# Patient Record
Sex: Female | Born: 1970 | Race: White | Hispanic: No | Marital: Married | Smoking: Former smoker
Health system: Southern US, Community
[De-identification: ages and names within clinical notes are randomized; demographics above are authoritative.]

## PROBLEM LIST (undated history)

## (undated) DIAGNOSIS — B019 Varicella without complication: Secondary | ICD-10-CM

## (undated) DIAGNOSIS — T7840XA Allergy, unspecified, initial encounter: Secondary | ICD-10-CM

## (undated) DIAGNOSIS — F329 Major depressive disorder, single episode, unspecified: Secondary | ICD-10-CM

## (undated) DIAGNOSIS — F32A Depression, unspecified: Secondary | ICD-10-CM

## (undated) HISTORY — PX: ABDOMINOPLASTY: SUR9

## (undated) HISTORY — PX: AUGMENTATION MAMMAPLASTY: SUR837

## (undated) HISTORY — DX: Depression, unspecified: F32.A

## (undated) HISTORY — DX: Allergy, unspecified, initial encounter: T78.40XA

## (undated) HISTORY — DX: Varicella without complication: B01.9

---
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
## 2014-12-07 HISTORY — PX: BREAST ENHANCEMENT SURGERY: SHX7

## 2019-08-23 ENCOUNTER — Encounter: Payer: Self-pay | Admitting: Family Medicine

## 2019-08-23 ENCOUNTER — Ambulatory Visit (INDEPENDENT_AMBULATORY_CARE_PROVIDER_SITE_OTHER): Payer: PRIVATE HEALTH INSURANCE | Admitting: Family Medicine

## 2019-08-23 ENCOUNTER — Other Ambulatory Visit: Payer: Self-pay

## 2019-08-23 DIAGNOSIS — M25521 Pain in right elbow: Secondary | ICD-10-CM | POA: Diagnosis not present

## 2019-08-23 NOTE — Progress Notes (Signed)
   Office Visit Note   Patient: Allison Collins           Date of Birth: 20-Feb-1971           MRN: [id number] Visit Date: 08/23/2019 Requested by: No referring provider defined for this encounter. PCP: No primary care provider on file.  Subjective: Chief Complaint  Patient presents with  . Right Elbow - Pain    Pain lateral aspect, after hitting arm against something hard while packing to move approx 12 weeks ago. Numbness/tingling in the right hand/fingers.Right-hand dominant. Has had xray (neg) & cortisone injection (helped).    HPI: She is here with right elbow pain.  She is right-hand dominant.  About 3 or 4 months ago while preparing to move from [place] to [facility], she bumped her elbow against something hard.  She went to her orthopedist in [place] who took x-rays which were negative, then gave her a tennis elbow injection.  She was completely pain-free until the past couple weeks.  The pain is coming back again, similar to what it was before.  Denies any numbness or tingling in her hand.              ROS: Denies fevers or chills.  All other systems were reviewed and are negative.  Objective: Vital Signs: There were no vitals taken for this visit.  Physical Exam:  General:  Alert and oriented, in no acute distress. Pulm:  Breathing unlabored. Psy:  Normal mood, congruent affect. Skin: No atrophy or discoloration. Right elbow: Full flexion extension, full forearm pronation and supination.  She is point tender at the common extensor tendon at the lateral epicondyle.  No significant pain at the radial tunnel.  Moderate pain with wrist extension and third finger extension against resistance as well as forearm supination.  Imaging: Limited diagnostic ultrasound was performed but I did not bill for it.  She has tendinopathy changes at the common extensor tendon but no obvious tears.  Assessment & Plan: 1.  Left lateral epicondylitis -Discussed options with her and elected to  reinject today.  If symptoms return, then physical/hand therapy.     Procedures: Right elbow injection: After sterile prep with Betadine, injected 2 cc 1% lidocaine without epinephrine and 40 mg methylprednisolone into the area of maximum tenderness at the common extensor tendon.  She had excellent immediate relief.  She will be cautious with lifting for the next 10 to 14 days.   PMFS History: There are no active problems to display for this patient.  History reviewed. No pertinent past medical history.  History reviewed. No pertinent family history.  History reviewed. No pertinent surgical history. Social History   Occupational History  . Not on file  Tobacco Use  . Smoking status: Former Smoker    Types: Cigarettes    Quit date: 08/22/2005    Years since quitting: 14.0  . Smokeless tobacco: Never Used  Substance and Sexual Activity  . Alcohol use: Yes    Comment: rarely  . Drug use: Not on file  . Sexual activity: Not on file

## 2019-09-12 ENCOUNTER — Other Ambulatory Visit: Payer: Self-pay

## 2019-09-12 ENCOUNTER — Ambulatory Visit (INDEPENDENT_AMBULATORY_CARE_PROVIDER_SITE_OTHER): Payer: PRIVATE HEALTH INSURANCE | Admitting: Family Medicine

## 2019-09-12 ENCOUNTER — Encounter: Payer: Self-pay | Admitting: Family Medicine

## 2019-09-12 VITALS — BP 107/65 | HR 65 | Temp 98.0°F | Resp 18 | Ht 64.5 in | Wt 132.4 lb

## 2019-09-12 DIAGNOSIS — F320 Major depressive disorder, single episode, mild: Secondary | ICD-10-CM | POA: Diagnosis not present

## 2019-09-12 DIAGNOSIS — Z0000 Encounter for general adult medical examination without abnormal findings: Secondary | ICD-10-CM

## 2019-09-12 DIAGNOSIS — Z23 Encounter for immunization: Secondary | ICD-10-CM

## 2019-09-12 MED ORDER — FLUOXETINE HCL 20 MG PO CAPS: 20.0000 mg | ORAL_CAPSULE | Freq: Every day | ORAL | 1 refills | Status: DC

## 2019-09-12 NOTE — Progress Notes (Signed)
Patient ID: Allison Collins, female  DOB: 07-18-1971, 48 y.o.   MRN: [id number] Patient Care Team    Relationship Specialty Notifications Start End  Ma Hillock, DO PCP - General Family Medicine  09/12/19     Chief Complaint  Patient presents with  . [place] PCP Dr Sarajane Jews, [facility]. GYN-3-4 years, [place], [place].  Mammogram 2019. Needs refills and would like colonoscopy.     Subjective:  Allison Collins is a 48 y.o.  female present for new patient establishment. All past medical history, surgical history, allergies, family history, immunizations, medications and social history were updated in the electronic medical record today. All recent labs, ED visits and hospitalizations within the last year were reviewed. Pt recently moved from [place] with her family.   depression:  Pt reports she has been prescribed prozac since 2009 for depression. She reports current dose work well and she has no complaints. She would have moments of highs and lows when not on medication.    Depression screen PHQ 2/9 09/12/2019  Decreased Interest 0  Down, Depressed, Hopeless 0  PHQ - 2 Score 0  Altered sleeping 0  Tired, decreased energy 1  Change in appetite 0  Feeling bad or failure about yourself  2  Trouble concentrating 3  Moving slowly or fidgety/restless 0  Suicidal thoughts 0  PHQ-9 Score 6  Difficult doing work/chores Extremely dIfficult   GAD 7 : Generalized Anxiety Score 09/12/2019  Nervous, Anxious, on Edge 0  Control/stop worrying 0  Worry too much - different things 0  Trouble relaxing 3  Easily annoyed or irritable 3  Afraid - awful might happen 0  Anxiety Difficulty Extremely difficult       No flowsheet data found.  Immunization History  Administered Date(s) Administered  . Influenza,inj,Quad PF,6+ Mos 09/12/2019   No exam data present  Past Medical History:  Diagnosis Date  . Chicken pox   . Depression     No Known Allergies Past Surgical History:  Procedure Laterality Date  . ABDOMINOPLASTY    . BREAST ENHANCEMENT SURGERY    . CESAREAN SECTION  2014   Family History  Problem Relation Age of Onset  . Drug abuse Mother   . Early death Mother   . Stroke Mother   . Pulmonary fibrosis Mother        passed away from condition  . Hearing loss Father   . Alzheimer's disease Maternal Grandmother   . Colon cancer Maternal Grandfather    Social History   Social History Narrative   Marital status/children/pets: Married, 1 child   Education/employment: college, homemaker   Safety:      -smoke alarm in the home:Yes     - wears seatbelt: Yes     - Feels safe in their relationships: Yes    Allergies as of 09/12/2019   No Known Allergies     Medication List       Accurate as of September 12, 2019  1:28 PM. If you have any questions, ask your nurse or doctor.        BIOTIN PO Take by mouth daily.   FLUoxetine 20 MG capsule Commonly known as: PROZAC Take 20 mg by mouth daily.   multivitamin tablet Take 1 tablet by mouth daily.   nicotine polacrilex 4 MG gum Commonly known as: NICORETTE Take 4 mg by mouth as needed for smoking cessation.  All past medical history, surgical history, allergies, family history, immunizations andmedications were updated in the EMR today and reviewed under the history and medication portions of their EMR.    No results found for this or any previous visit (from the past 2160 hour(s)).  Patient was never admitted.   ROS: 14 pt review of systems performed and negative (unless mentioned in an HPI)  Objective: BP 107/65 (BP Location: Left Arm, Patient Position: Sitting, Cuff Size: Normal)   Pulse 65   Temp 98 F (36.7 C) (Temporal)   Resp 18   Ht 5' 4.5" (1.638 m)   Wt 132 lb 6 oz (60 kg)   LMP 08/26/2019 (Exact Date)   SpO2 98%   BMI 22.37 kg/m  Gen: Afebrile. No acute distress. Nontoxic in appearance, well-developed,  well-nourished,  Pleasant caucasian female.  HENT: AT. .  Eyes:Pupils Equal Round Reactive to light, Extraocular movements intact,  Conjunctiva without redness, discharge or icterus. CV: RRR Chest: CTAB, no wheeze, rhonchi or crackles.  Skin:Warm and well-perfused. Skin intact. Neuro/Msk: Normal gait. PERLA. EOMi. Alert. Oriented x3.   Psych: Normal affect, dress and demeanor. Normal speech. Normal thought content and judgment.   Assessment/plan: Allison Collins is a 48 y.o. female present for establish care Need for influenza vaccination - Flu Vaccine QUAD 36+ mos IM Depression, major, single episode, mild (HCC) Stable.  Refilled prozac 20 mg QD.  F/u q 6 mos    CPE in 3 mos.   Greater than 30 minutes spent with patient, >50% of time spent face to face.    Note is dictated utilizing voice recognition software. Although note has been proof read prior to signing, occasional typographical errors still can be missed. If any questions arise, please do not hesitate to call for verification.  Electronically signed by: Howard Pouch, DO [place]

## 2019-09-12 NOTE — Patient Instructions (Addendum)
Pleasure to meet you today.  I have refilled your medication. We will get back in 3 mos for physical and fasting labs.    Please help [place] help you:  We are honored you have chosen [facility] for your Primary Care home. Below you will find basic instructions that you may need to access in the future. Please help [place] help you by reading the instructions, which cover many of the frequent questions we experience.   Prescription refills and request:  -In order to allow more efficient response time, please call your pharmacy for all refills. They will forward the request electronically to [place]. This allows for the quickest possible response. Request left on a nurse line can take longer to refill, since these are checked as time allows between office patients and other phone calls.  - refill request can take up to 3-5 working days to complete.  - If request is sent electronically and request is appropiate, it is usually completed in 1-2 business days.  - all patients will need to be seen routinely for all chronic medical conditions requiring prescription medications (see follow-up below). If you are overdue for follow up on your condition, you will be asked to make an appointment and we will call in enough medication to cover you until your appointment (up to 30 days).  - all controlled substances will require a face to face visit to request/refill.  - if you desire your prescriptions to go through a new pharmacy, and have an active script at original pharmacy, you will need to call your pharmacy and have scripts transferred to new pharmacy. This is completed between the pharmacy locations and not by your provider.    Results: If any images or labs were ordered, it can take up to 1 week to get results depending on the test ordered and the lab/facility running and resulting the test. - Normal or stable results, which do not need further discussion, may be released to your mychart immediately with attached  note to you. A call may not be generated for normal results. Please make certain to sign up for mychart. If you have questions on how to activate your mychart you can call the front office.  - If your results need further discussion, our office will attempt to contact you via phone, and if unable to reach you after 2 attempts, we will release your abnormal result to your mychart with instructions.  - All results will be automatically released in mychart after 1 week.  - Your provider will provide you with explanation and instruction on all relevant material in your results. Please keep in mind, results and labs may appear confusing or abnormal to the untrained eye, but it does not mean they are actually abnormal for you personally. If you have any questions about your results that are not covered, or you desire more detailed explanation than what was provided, you should make an appointment with your provider to do so.   Our office handles many outgoing and incoming calls daily. If we have not contacted you within 1 week about your results, please check your mychart to see if there is a message first and if not, then contact our office.  In helping with this matter, you help decrease call volume, and therefore allow [place] to be able to respond to patients needs more efficiently.   Acute office visits (sick visit):  An acute visit is intended for a new problem and are scheduled in shorter time  slots to allow schedule openings for patients with new problems. This is the appropriate visit to discuss a new problem. Problems will not be addressed by phone call or Echart message. Appointment is needed if requesting treatment. In order to provide you with excellent quality medical care with proper time for you to explain your problem, have an exam and receive treatment with instructions, these appointments should be limited to one new problem per visit. If you experience a new problem, in which you desire to be  addressed, please make an acute office visit, we save openings on the schedule to accommodate you. Please do not save your new problem for any other type of visit, let us take care of it properly and quickly for you.   Follow up visits:  Depending on your condition(s) your provider will need to see you routinely in order to provide you with quality care and prescribe medication(s). Most chronic conditions (Example: hypertension, Diabetes, depression/anxiety... etc), require visits a couple times a year. Your provider will instruct you on proper follow up for your personal medical conditions and history. Please make certain to make follow up appointments for your condition as instructed. Failing to do so could result in lapse in your medication treatment/refills. If you request a refill, and are overdue to be seen on a condition, we will always provide you with a 30 day script (once) to allow you time to schedule.    Medicare wellness (well visit): - we have a wonderful Nurse Maudie Mercury), that will meet with you and provide you will yearly medicare wellness visits. These visits should occur yearly (can not be scheduled less than 1 calendar year apart) and cover preventive health, immunizations, advance directives and screenings you are entitled to yearly through your medicare benefits. Do not miss out on your entitled benefits, this is when medicare will pay for these benefits to be ordered for you.  These are strongly encouraged by your provider and is the appropriate type of visit to make certain you are up to date with all preventive health benefits. If you have not had your medicare wellness exam in the last 12 months, please make certain to schedule one by calling the office and schedule your medicare wellness with Maudie Mercury as soon as possible.   Yearly physical (well visit):  - Adults are recommended to be seen yearly for physicals. Check with your insurance and date of your last physical, most insurances  require one calendar year between physicals. Physicals include all preventive health topics, screenings, medical exam and labs that are appropriate for gender/age and history. You may have fasting labs needed at this visit. This is a well visit (not a sick visit), new problems should not be covered during this visit (see acute visit).  - Pediatric patients are seen more frequently when they are younger. Your provider will advise you on well child visit timing that is appropriate for your their age. - This is not a medicare wellness visit. Medicare wellness exams do not have an exam portion to the visit. Some medicare companies allow for a physical, some do not allow a yearly physical. If your medicare allows a yearly physical you can schedule the medicare wellness with our nurse Maudie Mercury and have your physical with your provider after, on the same day. Please check with insurance for your full benefits.   Late Policy/No Shows:  - all new patients should arrive 15-30 minutes earlier than appointment to allow [place] time  to  obtain all personal  demographics,  insurance information and for you to complete office paperwork. - All established patients should arrive 10-15 minutes earlier than appointment time to update all information and be checked in .  - In our best efforts to run on time, if you are late for your appointment you will be asked to either reschedule or if able, we will work you back into the schedule. There will be a wait time to work you back in the schedule,  depending on availability.  - If you are unable to make it to your appointment as scheduled, please call 24 hours ahead of time to allow [place] to fill the time slot with someone else who needs to be seen. If you do not cancel your appointment ahead of time, you may be charged a no show fee.

## 2019-10-30 ENCOUNTER — Telehealth: Payer: Self-pay | Admitting: Family Medicine

## 2019-10-30 NOTE — Telephone Encounter (Signed)
Pt was called and message was left letting patient know that she needed to schedule her CPE the first on the year to have all this ordered, Pt did not answer and VM was full. My chart message was sent.

## 2019-10-30 NOTE — Telephone Encounter (Signed)
Patient needs colonoscopy since she has turned 48. She has never had one.  Patient also is snoring so loud she is waking up her husband. Patient is requesting a referral for a sleep study.

## 2019-10-31 NOTE — Telephone Encounter (Signed)
MyChart message read. Pt scheduled appt for 12/1 for CPE

## 2019-11-07 ENCOUNTER — Other Ambulatory Visit: Payer: Self-pay

## 2019-11-08 ENCOUNTER — Encounter: Payer: Self-pay | Admitting: Family Medicine

## 2019-11-08 ENCOUNTER — Ambulatory Visit (INDEPENDENT_AMBULATORY_CARE_PROVIDER_SITE_OTHER): Payer: PRIVATE HEALTH INSURANCE | Admitting: Family Medicine

## 2019-11-08 ENCOUNTER — Encounter: Payer: Self-pay | Admitting: Gastroenterology

## 2019-11-08 VITALS — BP 104/67 | HR 63 | Temp 97.7°F | Resp 17 | Ht 64.85 in | Wt 139.5 lb

## 2019-11-08 DIAGNOSIS — R0683 Snoring: Secondary | ICD-10-CM | POA: Insufficient documentation

## 2019-11-08 DIAGNOSIS — Z23 Encounter for immunization: Secondary | ICD-10-CM | POA: Diagnosis not present

## 2019-11-08 DIAGNOSIS — Z130 Encounter for screening for diseases of the blood and blood-forming organs and certain disorders involving the immune mechanism: Secondary | ICD-10-CM

## 2019-11-08 DIAGNOSIS — Z1211 Encounter for screening for malignant neoplasm of colon: Secondary | ICD-10-CM

## 2019-11-08 DIAGNOSIS — Z1231 Encounter for screening mammogram for malignant neoplasm of breast: Secondary | ICD-10-CM

## 2019-11-08 DIAGNOSIS — Z0000 Encounter for general adult medical examination without abnormal findings: Secondary | ICD-10-CM | POA: Diagnosis not present

## 2019-11-08 DIAGNOSIS — Z13220 Encounter for screening for lipoid disorders: Secondary | ICD-10-CM

## 2019-11-08 DIAGNOSIS — F320 Major depressive disorder, single episode, mild: Secondary | ICD-10-CM | POA: Diagnosis not present

## 2019-11-08 DIAGNOSIS — Z131 Encounter for screening for diabetes mellitus: Secondary | ICD-10-CM

## 2019-11-08 DIAGNOSIS — Z01419 Encounter for gynecological examination (general) (routine) without abnormal findings: Secondary | ICD-10-CM

## 2019-11-08 NOTE — Patient Instructions (Signed)
Health Maintenance, Female Adopting a healthy lifestyle and getting preventive care are important in promoting health and wellness. Ask your health care provider about:  The right schedule for you to have regular tests and exams.  Things you can do on your own to prevent diseases and keep yourself healthy. What should I know about diet, weight, and exercise? Eat a healthy diet   Eat a diet that includes plenty of vegetables, fruits, low-fat dairy products, and lean protein.  Do not eat a lot of foods that are high in solid fats, added sugars, or sodium. Maintain a healthy weight Body mass index (BMI) is used to identify weight problems. It estimates body fat based on height and weight. Your health care provider can help determine your BMI and help you achieve or maintain a healthy weight. Get regular exercise Get regular exercise. This is one of the most important things you can do for your health. Most adults should:  Exercise for at least 150 minutes each week. The exercise should increase your heart rate and make you sweat (moderate-intensity exercise).  Do strengthening exercises at least twice a week. This is in addition to the moderate-intensity exercise.  Spend less time sitting. Even light physical activity can be beneficial. Watch cholesterol and blood lipids Have your blood tested for lipids and cholesterol at 50 years of age, then have this test every 5 years. Have your cholesterol levels checked more often if:  Your lipid or cholesterol levels are high.  You are older than 50 years of age.  You are at high risk for heart disease. What should I know about cancer screening? Depending on your health history and family history, you may need to have cancer screening at various ages. This may include screening for:  Breast cancer.  Cervical cancer.  Colorectal cancer.  Skin cancer.  Lung cancer. What should I know about heart disease, diabetes, and high blood  pressure? Blood pressure and heart disease  High blood pressure causes heart disease and increases the risk of stroke. This is more likely to develop in people who have high blood pressure readings, are of African descent, or are overweight.  Have your blood pressure checked: ? Every 3-5 years if you are 18-39 years of age. ? Every year if you are 40 years old or older. Diabetes Have regular diabetes screenings. This checks your fasting blood sugar level. Have the screening done:  Once every three years after age 40 if you are at a normal weight and have a low risk for diabetes.  More often and at a younger age if you are overweight or have a high risk for diabetes. What should I know about preventing infection? Hepatitis B If you have a higher risk for hepatitis B, you should be screened for this virus. Talk with your health care provider to find out if you are at risk for hepatitis B infection. Hepatitis C Testing is recommended for:  Everyone born from 1945 through 1965.  Anyone with known risk factors for hepatitis C. Sexually transmitted infections (STIs)  Get screened for STIs, including gonorrhea and chlamydia, if: ? You are sexually active and are younger than 50 years of age. ? You are older than 50 years of age and your health care provider tells you that you are at risk for this type of infection. ? Your sexual activity has changed since you were last screened, and you are at increased risk for chlamydia or gonorrhea. Ask your health care provider if   you are at risk.  Ask your health care provider about whether you are at high risk for HIV. Your health care provider may recommend a prescription medicine to help prevent HIV infection. If you choose to take medicine to prevent HIV, you should first get tested for HIV. You should then be tested every 3 months for as long as you are taking the medicine. Pregnancy  If you are about to stop having your period (premenopausal) and  you may become pregnant, seek counseling before you get pregnant.  Take 400 to 800 micrograms (mcg) of folic acid every day if you become pregnant.  Ask for birth control (contraception) if you want to prevent pregnancy. Osteoporosis and menopause Osteoporosis is a disease in which the bones lose minerals and strength with aging. This can result in bone fractures. If you are 65 years old or older, or if you are at risk for osteoporosis and fractures, ask your health care provider if you should:  Be screened for bone loss.  Take a calcium or vitamin D supplement to lower your risk of fractures.  Be given hormone replacement therapy (HRT) to treat symptoms of menopause. Follow these instructions at home: Lifestyle  Do not use any products that contain nicotine or tobacco, such as cigarettes, e-cigarettes, and chewing tobacco. If you need help quitting, ask your health care provider.  Do not use street drugs.  Do not share needles.  Ask your health care provider for help if you need support or information about quitting drugs. Alcohol use  Do not drink alcohol if: ? Your health care provider tells you not to drink. ? You are pregnant, may be pregnant, or are planning to become pregnant.  If you drink alcohol: ? Limit how much you use to 0-1 drink a day. ? Limit intake if you are breastfeeding.  Be aware of how much alcohol is in your drink. In the [place] one drink equals one 12 oz bottle of beer (355 mL), one 5 oz glass of wine (148 mL), or one 1 oz glass of hard liquor (44 mL). General instructions  Schedule regular health, dental, and eye exams.  Stay current with your vaccines.  Tell your health care provider if: ? You often feel depressed. ? You have ever been abused or do not feel safe at home. Summary  Adopting a healthy lifestyle and getting preventive care are important in promoting health and wellness.  Follow your health care provider's instructions about healthy  diet, exercising, and getting tested or screened for diseases.  Follow your health care provider's instructions on monitoring your cholesterol and blood pressure. This information is not intended to replace advice given to you by your health care provider. Make sure you discuss any questions you have with your health care provider. Document Released: 06/08/2011 Document Revised: 11/16/2018 Document Reviewed: 11/16/2018 [employer]vier Patient Education  2020 [employer]  

## 2019-11-08 NOTE — Progress Notes (Signed)
Patient ID: Allison Collins, female  DOB: 1971/03/17, 48 y.o.   MRN: [id number] Patient Care Team    Relationship Specialty Notifications Start End  Ma Hillock, DO PCP - General Family Medicine  09/12/19     Chief Complaint  Patient presents with  . Annual Exam    Not fasting. Pt had GYN and mammogram- 2019-Suffix [place] referral for GYN. Would like colonoscopy.     Subjective:  Allison Collins is a 48 y.o.  Female  present for CPE. All past medical history, surgical history, allergies, family history, immunizations, medications and social history were updated in the electronic medical record today. All recent labs, ED visits and hospitalizations within the last year were reviewed.  Pt complains today of snoring. She states her husband and her now have to sleep in separate rooms.  She has snored for years, but she has noticed an increase in the intensity of her snoring over the last year. She denies witness apneic spells or excessive daytime sleepiness. Body mass index is 23.32 kg/m.   Patient reports she snores a great deal.  Her husband and family has noted that her snoring has worsened over the last year.  They are now sleeping in separate bedrooms secondary to her snoring.  She states she often wakes her own self with her snoring.  She denies any known apneic spells.  She is fatigued but denies daytime somnolence.  She would like referral for evaluation for sleep study.  Health maintenance:  Colonoscopy: MGF had colon cancer in his 63. Referral placed today.  Mammogram: completed:2019, [place]>> ordered today. Pt has silicone implants.  Cervical cancer screening: last pap: she is uncertain her last.  Immunizations: tdap updated today, Influenza UTD 2020(encouraged yearly), shingrix #1 today.>> N0.2 in 3 mos by nurse visit.  Infectious disease screening: HIV declined DEXA: routine screen Assistive device: none Oxygen MOL:MBEM Patient has a  Dental home. Hospitalizations/ED visits: none   Depression screen [facility] 2/9 11/08/2019 09/12/2019  Decreased Interest 0 0  Down, Depressed, Hopeless 0 0  PHQ - 2 Score 0 0  Altered sleeping 0 0  Tired, decreased energy 0 1  Change in appetite 0 0  Feeling bad or failure about yourself  0 2  Trouble concentrating 0 3  Moving slowly or fidgety/restless 0 0  Suicidal thoughts 0 0  PHQ-9 Score 0 6  Difficult doing work/chores Not difficult at all Extremely dIfficult   GAD 7 : Generalized Anxiety Score 09/12/2019  Nervous, Anxious, on Edge 0  Control/stop worrying 0  Worry too much - different things 0  Trouble relaxing 3  Easily annoyed or irritable 3  Afraid - awful might happen 0  Anxiety Difficulty Extremely difficult     Immunization History  Administered Date(s) Administered  . Influenza,inj,Quad PF,6+ Mos 09/12/2019    Past Medical History:  Diagnosis Date  . Chicken pox   . Depression    No Known Allergies Past Surgical History:  Procedure Laterality Date  . ABDOMINOPLASTY    . BREAST ENHANCEMENT SURGERY  2016   saline > 10 yrs with revision 7544 to silicone.   Marland Kitchen CESAREAN SECTION  2014   Family History  Problem Relation Age of Onset  . Drug abuse Mother   . Early death Mother 45  . Stroke Mother   . Pulmonary fibrosis Mother        passed away from condition  . Hearing loss Father   . Alzheimer's disease  Maternal Grandmother   . Colon cancer Maternal Grandfather    Social History   Social History Narrative   Marital status/children/pets: Married, 1 child   Education/employment: college, homemaker   Safety:      -smoke alarm in the home:Yes     - wears seatbelt: Yes     - Feels safe in their relationships: Yes    Allergies as of 11/08/2019   No Known Allergies     Medication List       Accurate as of November 08, 2019  2:24 PM. If you have any questions, ask your nurse or doctor.        BIOTIN PO Take by mouth daily.   FLUoxetine 20 MG  capsule Commonly known as: PROZAC Take 1 capsule (20 mg total) by mouth daily.   multivitamin tablet Take 1 tablet by mouth daily.   nicotine polacrilex 4 MG gum Commonly known as: NICORETTE Take 4 mg by mouth as needed for smoking cessation.       All past medical history, surgical history, allergies, family history, immunizations andmedications were updated in the EMR today and reviewed under the history and medication portions of their EMR.     No results found for this or any previous visit (from the past 2160 hour(s)).  Patient was never admitted.   ROS: 14 pt review of systems performed and negative (unless mentioned in an HPI)  Objective: BP 104/67 (BP Location: Left Arm, Patient Position: Sitting, Cuff Size: Normal)   Pulse 63   Temp 97.7 F (36.5 C) (Temporal)   Resp 17   Ht 5' 4.85" (1.647 m)   Wt 139 lb 8 oz (63.3 kg)   LMP 10/25/2019 (Approximate)   SpO2 100%   BMI 23.32 kg/m  Gen: Afebrile. No acute distress. Nontoxic in appearance, well-developed, well-nourished,  Pleasant caucasian female.  HENT: AT. [place]. Bilateral TM visualized and normal in appearance, normal external auditory canal. MMM, no oral lesions, adequate dentition. Bilateral nares within normal limits.  Septum midline.  Throat without erythema, ulcerations or exudates. Tonsils normal.  no Cough on exam, no hoarseness on exam.  Eyes:Pupils Equal Round Reactive to light, Extraocular movements intact,  Conjunctiva without redness, discharge or icterus. Neck/lymp/endocrine: Supple,no lymphadenopathy, no thyromegaly CV: RRR no murmur, no edema, +2/4 P posterior tibialis pulse. No JVD. Chest: CTAB, no wheeze, rhonchi or crackles. normal Respiratory effort. good Air movement. Abd: Soft. flat. NTND. BS present. no Masses palpated. No hepatosplenomegaly. No rebound tenderness or guarding. Skin: no rashes, purpura or petechiae. Warm and well-perfused. Skin intact. Neuro/Msk:  Normal gait. PERLA. EOMi. Alert.  Oriented x3.  Cranial nerves II through XII intact. Muscle strength 5/5 upper/lower extremity. DTRs equal bilaterally. Psych: Normal affect, dress and demeanor. Normal speech. Normal thought content and judgment.   No exam data present  Assessment/plan: Allison Collins is a 48 y.o. female present for CPE Depression, major, single episode, mild (HCC) Stable.  Continue Prozac 20 mg daily.  Follow-up every 6 months. - TSH Diabetes mellitus screening - HgB A1c Screening cholesterol level - Lipid panel Screening for deficiency anemia - CBC - Comp Met (CMET) Needs to establish with GYN for routine Visit for pelvic exam - Ambulatory referral to Gynecology Encounter for screening mammogram for malignant neoplasm of breast - MM DIGITAL SCREENING W/ IMPLANTS BILATERAL; Future Snoring She would like evaluation for sleep study today.  Referral placed to pulmonology. - Ambulatory referral to Pulmonology Colon cancer screen: Referred to GI.   Encounter for  preventive Health exam:  Patient was encouraged to exercise greater than 150 minutes a week. Patient was encouraged to choose a diet filled with fresh fruits and vegetables, and lean meats. AVS provided to patient today for education/recommendation on gender specific health and safety maintenance. Colonoscopy: MGF had colon cancer in his 75. Referral placed today.  Mammogram: completed:[place], [place]>> ordered today. Pt has silicone implants.  Cervical cancer screening: last pap: she is uncertain her last.  Referral placed to gynecology today. Immunizations: tdap updated today, Influenza UTD 2020(encouraged yearly), shingrix #1 today.>> N0.2 in 3 mos by nurse visit.  Infectious disease screening: HIV declined DEXA: routine screen  This visit occurred during the SARS-CoV-2 public health emergency.  Safety protocols were in place, including screening questions prior to the visit, additional usage of staff PPE, and extensive cleaning of exam  room while observing appropriate contact time as indicated for disinfecting solutions.   Orders Placed This Encounter  Procedures  . MM DIGITAL SCREENING W/ IMPLANTS BILATERAL  . CBC  . Comp Met (CMET)  . HgB A1c  . Lipid panel  . TSH  . Ambulatory referral to Gynecology  . Ambulatory referral to Pulmonology  . Ambulatory referral to Gastroenterology    Return in about 1 year (around 11/07/2020) for CPE (30 min). and routinely per OV note for [facility].    Electronically signed by: Howard Pouch, DO [place]

## 2019-11-09 LAB — COMPREHENSIVE METABOLIC PANEL
AG Ratio: 1.7 (calc) (ref 1.0–2.5)
ALT: 25 U/L (ref 6–29)
AST: 29 U/L (ref 10–35)
Albumin: 4.6 g/dL (ref 3.6–5.1)
Alkaline phosphatase (APISO): 62 U/L (ref 37–153)
BUN: 16 mg/dL (ref 7–25)
CO2: 26 mmol/L (ref 20–32)
Calcium: 9.4 mg/dL (ref 8.6–10.4)
Chloride: 103 mmol/L (ref 98–110)
Creat: 0.93 mg/dL (ref 0.50–1.05)
Globulin: 2.7 g/dL (calc) (ref 1.9–3.7)
Glucose, Bld: 66 mg/dL (ref 65–99)
Potassium: 4.5 mmol/L (ref 3.5–5.3)
Sodium: 139 mmol/L (ref 135–146)
Total Bilirubin: 0.5 mg/dL (ref 0.2–1.2)
Total Protein: 7.3 g/dL (ref 6.1–8.1)

## 2019-11-09 LAB — TSH: TSH: 1.34 mIU/L

## 2019-11-09 LAB — CBC
HCT: 36.8 % (ref 35.0–45.0)
Hemoglobin: 12.1 g/dL (ref 11.7–15.5)
MCH: 28.8 pg (ref 27.0–33.0)
MCHC: 32.9 g/dL (ref 32.0–36.0)
MCV: 87.6 fL (ref 80.0–100.0)
MPV: 10.7 fL (ref 7.5–12.5)
Platelets: 315 10*3/uL (ref 140–400)
RBC: 4.2 10*6/uL (ref 3.80–5.10)
RDW: 12.9 % (ref 11.0–15.0)
WBC: 5.3 10*3/uL (ref 3.8–10.8)

## 2019-11-09 LAB — LIPID PANEL
Cholesterol: 238 mg/dL — ABNORMAL HIGH (ref <200)
HDL: 104 mg/dL (ref >=50)
LDL Cholesterol (Calc): 119 mg/dL (calc) — ABNORMAL HIGH
Non-HDL Cholesterol (Calc): 134 mg/dL (calc) — ABNORMAL HIGH (ref <130)
Total CHOL/HDL Ratio: 2.3 (calc) (ref <5.0)
Triglycerides: 56 mg/dL (ref <150)

## 2019-11-09 LAB — HEMOGLOBIN A1C
Hgb A1c MFr Bld: 5.4 % of total Hgb (ref <5.7)
Mean Plasma Glucose: 108 (calc)
eAG (mmol/L): 6 (calc)

## 2019-11-13 ENCOUNTER — Encounter: Payer: Self-pay | Admitting: Family Medicine

## 2019-11-29 ENCOUNTER — Ambulatory Visit (AMBULATORY_SURGERY_CENTER): Payer: PRIVATE HEALTH INSURANCE | Admitting: *Deleted

## 2019-11-29 ENCOUNTER — Other Ambulatory Visit: Payer: Self-pay

## 2019-11-29 VITALS — Temp 96.9°F | Ht 64.85 in | Wt 140.0 lb

## 2019-11-29 DIAGNOSIS — Z1211 Encounter for screening for malignant neoplasm of colon: Secondary | ICD-10-CM

## 2019-11-29 DIAGNOSIS — Z1159 Encounter for screening for other viral diseases: Secondary | ICD-10-CM

## 2019-11-29 MED ORDER — SUPREP BOWEL PREP KIT 17.5-3.13-1.6 GM/177ML PO SOLN: 1.0000 | Freq: Once | ORAL | 0 refills | Status: AC

## 2019-11-29 NOTE — Progress Notes (Signed)
No egg or soy allergy known to patient  No issues with past sedation with any surgeries  or procedures, no intubation problems  No diet pills per patient No home 02 use per patient  No blood thinners per patient  Pt denies issues with constipation  No A fib or A flutter  EMMI video sent to pt's e mail   Due to the COVID-19 pandemic we are asking patients to follow these guidelines. Please only bring one care partner. Please be aware that your care partner may wait in the car in the parking lot or if they feel like they will be too hot to wait in the car, they may wait in the lobby on the 4th floor. All care partners are required to wear a mask the entire time (we do not have any that we can provide them), they need to practice social distancing, and we will do a Covid check for all patient's and care partners when you arrive. Also we will check their temperature and your temperature. If the care partner waits in their car they need to stay in the parking lot the entire time and we will call them on their cell phone when the patient is ready for discharge so they can bring the car to the front of the building. Also all patient's will need to wear a mask into building. Suprep $15

## 2019-12-04 ENCOUNTER — Encounter: Payer: Self-pay | Admitting: Gastroenterology

## 2019-12-05 ENCOUNTER — Ambulatory Visit (INDEPENDENT_AMBULATORY_CARE_PROVIDER_SITE_OTHER): Payer: PRIVATE HEALTH INSURANCE | Admitting: Neurology

## 2019-12-05 ENCOUNTER — Other Ambulatory Visit: Payer: Self-pay

## 2019-12-05 ENCOUNTER — Encounter: Payer: Self-pay | Admitting: Neurology

## 2019-12-05 VITALS — BP 118/84 | HR 65 | Temp 97.6°F | Ht 63.0 in | Wt 148.0 lb

## 2019-12-05 DIAGNOSIS — R0683 Snoring: Secondary | ICD-10-CM | POA: Diagnosis not present

## 2019-12-05 DIAGNOSIS — G4752 REM sleep behavior disorder: Secondary | ICD-10-CM | POA: Diagnosis not present

## 2019-12-05 DIAGNOSIS — J3089 Other allergic rhinitis: Secondary | ICD-10-CM | POA: Diagnosis not present

## 2019-12-05 NOTE — Patient Instructions (Signed)

## 2019-12-05 NOTE — Progress Notes (Signed)
SLEEP MEDICINE CLINIC    Provider:  Larey Seat, MD  Primary Care Physician:  Ma Hillock, DO [address] [place] [address]     Referring Provider: Ma Hillock, Do [address],  [place] [address]          Chief Complaint according to patient   Patient presents with:    . New Patient (Initial Visit)           HISTORY OF PRESENT ILLNESS:  Allison Collins is a 48 y.o. year old Caucasian female patient seen here upon  referral on 12/05/2019 from Dr. Raoul Pitch for a report of loud snoring, excessive daytime sleepiness, sleep eating and sleep masturbation.      I have the pleasure of seeing Allison Collins today, a right -handed [place]  female with a possible sleep disorder.  She  has a past medical history of Allergy, Chicken pox, and Depression.    Sleep relevant medical history: loud snoring wakes her husband up, she has been eating at night,  At about 3 AM- has recollection of these frequent events. Has also masturbated in her sleep.    Social history:  Patient is working as Agricultural engineer and lives in a household with 3 persons. Family status is married with one son , 2 stepchildren. .  Pets are present, dogs. She has a horse.  Tobacco use: quit in pregnancy- 2006 ETOH use : socially,  Caffeine intake in form of Coffee( 3 cups) Soda( rare ) Tea ( rare) or energy drinks. Regular exercise in form of riding.   Hobbies : equestrian , Merchandiser, retail, motorcycling.    Sleep habits are as follows: The patient's dinner time is between 6 PM. She cooks dinner.  The patient goes to bed at 11 PM and continues to sleep for 2-3 hours, wakes for 1 bathroom break. She goes right back to sleep.  The preferred sleep position is left side , with the support of 3 pillows.  Dreams are reportedly frequent/vivid.   7 AM is the usual rise time. The patient wakes up spontaneously at about 6.30  She reports  feeling happy, refreshed - restored in AM,  without symptoms such as dry mouth , morning headaches or residual fatigue.  Naps are taken frequently,  Sometimes from 6-8 PM! - 2 hours of napping , but not affecting the night time sleep. Can be  refreshing than nocturnal sleep.     Her husband struggles with NASH, with obesity, asthma post -[id number] , he was a Higher education careers adviser in [place]. Has AAA. Abdominal hernias.    Review of Systems: Out of a complete 14 system review, the patient complains of only the following symptoms, and all other reviewed systems are negative.:  Fatigue, sleepiness , loud snoring, fragmented sleep, I   How likely are you to doze in the following situations: 0 = not likely, 1 = slight chance, 2 = moderate chance, 3 = high chance   Sitting and Reading? Watching Television? Sitting inactive in a public place (theater or meeting)? As a passenger in a car for an hour without a break? Lying down in the afternoon when circumstances permit? Sitting and talking to someone? Sitting quietly after lunch without alcohol? In a car, while stopped for a few minutes in traffic?   Total = 6/ 24 points   FSS endorsed at 21/ 63 points.   Social History   Socioeconomic History  . Marital status: Married  Spouse name: Not on file  . Number of children: Not on file  . Years of education: Not on file  . Highest education level: Not on file  Occupational History  . Not on file  Tobacco Use  . Smoking status: Former Smoker    Types: Cigarettes    Quit date: 08/22/2005    Years since quitting: 14.2  . Smokeless tobacco: Never Used  Substance and Sexual Activity  . Alcohol use: Yes    Comment: rarely  . Drug use: Yes    Types: Marijuana  . Sexual activity: Yes    Partners: Male  Other Topics Concern  . Not on file  Social History Narrative   Marital status/children/pets: Married, 1 child   Education/employment: college, homemaker   Safety:      -smoke alarm in the home:Yes     - wears seatbelt: Yes     - Feels safe in  their relationships: Yes   Social Determinants of Health   Financial Resource Strain:   . Difficulty of Paying Living Expenses: Not on file  Food Insecurity:   . Worried About Charity fundraiser in the Last Year: Not on file  . Ran Out of Food in the Last Year: Not on file  Transportation Needs:   . Lack of Transportation (Medical): Not on file  . Lack of Transportation (Non-Medical): Not on file  Physical Activity:   . Days of Exercise per Week: Not on file  . Minutes of Exercise per Session: Not on file  Stress:   . Feeling of Stress : Not on file  Social Connections:   . Frequency of Communication with Friends and Family: Not on file  . Frequency of Social Gatherings with Friends and Family: Not on file  . Attends Religious Services: Not on file  . Active Member of Clubs or Organizations: Not on file  . Attends Archivist Meetings: Not on file  . Marital Status: Not on file    Family History  Problem Relation Age of Onset  . Drug abuse Mother   . Early death Mother 77  . Stroke Mother   . Pulmonary fibrosis Mother        passed away from condition  . Hearing loss Father   . Alzheimer's disease Maternal Grandmother   . Colon cancer Maternal Grandfather   . Colon polyps Neg Hx   . Esophageal cancer Neg Hx   . Rectal cancer Neg Hx   . Stomach cancer Neg Hx     Past Medical History:  Diagnosis Date  . Allergy   . Chicken pox   . Depression     Past Surgical History:  Procedure Laterality Date  . ABDOMINOPLASTY    . BREAST ENHANCEMENT SURGERY  2016   saline > 10 yrs with revision [id number] to silicone.   Marland Kitchen CESAREAN SECTION  2014     Current Outpatient Medications on File Prior to Visit  Medication Sig Dispense Refill  . BIOTIN PO Take by mouth daily.    Marland Kitchen FLUoxetine (PROZAC) 20 MG capsule Take 1 capsule (20 mg total) by mouth daily. 90 capsule 1  . Multiple Vitamin (MULTIVITAMIN) tablet Take 1 tablet by mouth daily.    . nicotine polacrilex  (NICORETTE) 4 MG gum Take 4 mg by mouth as needed for smoking cessation.     No current facility-administered medications on file prior to visit.    No Known Allergies  Physical exam:  Today's Vitals  12/05/19 1254  BP: 118/84  Pulse: 65  Temp: 97.6 F (36.4 C)  Weight: 148 lb (67.1 kg)  Height: 5\' 3"  (1.6 m)   Body mass index is 26.22 kg/m.   Wt Readings from Last 3 Encounters:  12/05/19 148 lb (67.1 kg)  11/29/19 140 lb (63.5 kg)  11/08/19 139 lb 8 oz (63.3 kg)     Ht Readings from Last 3 Encounters:  12/05/19 5\' 3"  (1.6 m)  11/29/19 5' 4.85" (1.647 m)  11/08/19 5' 4.85" (1.647 m)      General: The patient is awake, alert and appears not in acute distress. The patient is well groomed. Head: Normocephalic, atraumatic. Neck is supple. Mallampati 3 with an elongated Uvula. ,  neck circumference:14 inches . Nasal airflow patent. Raspy voice.  Retrognathia is not seen.  Dental status:  Perfect.  Cardiovascular:  Regular rate and cardiac rhythm by pulse,  without distended neck veins. Respiratory: Lungs are clear to auscultation.  Skin:  Without evidence of ankle edema, or rash. Trunk: The patient's posture is erect.   Neurologic exam : The patient is awake and alert, oriented to place and time.   Memory subjective described as intact.  Attention span & concentration ability appears normal.  Speech is fluent,  without  dysarthria, dysphonia or aphasia.  Mood and affect are appropriate.   Cranial nerves: no loss of smell or taste reported  Pupils are equal and briskly reactive to light. Funduscopic exam deferred.   Extraocular movements in vertical and horizontal planes were intact and without nystagmus. No Diplopia. Visual fields by finger perimetry are intact. Hearing was intact to soft voice and finger rubbing.    Facial sensation intact to fine touch.  Facial motor strength is symmetric and tongue and uvula move midline.  Neck ROM : rotation, tilt and flexion  extension were normal for age and shoulder shrug was symmetrical.    Motor exam:  Symmetric bulk, tone and ROM.   Normal tone without cog- wheeling, symmetric grip strength .  Sensory:  Fine touch, pinprick and vibration were tested and normal.  Proprioception tested in the upper extremities was normal.   Coordination: Rapid alternating movements in the fingers/hands were of normal speed.  The Finger-to-nose maneuver was intact without evidence of ataxia, dysmetria or tremor.  Gait and station: Patient could rise unassisted from a seated position, walked without assistive device.  Stance is of normal width/ base and the patient turned with 3 steps ( RN observed) .  Toe and heel walk were deferred.  Deep tendon reflexes: in the  upper and lower extremities are symmetric and intact.  Babinski response was deferred.      After spending a total time of   minutes face to face and additional time for physical and neurologic examination, review of laboratory studies,  personal review of imaging studies, reports and results of other testing and review of referral information / records as far as provided in visit, I have established the following assessments:  1)  Loud snoring in a patient with seasonal rhinitis and loudest when in supine sleep is not a surprise.  Her airway has excessive soft tissue and she has become a mouth breather. I am less sure about apnea. It is definitely something to check out.   2)  Sleep eating may be related to late night snacks of simple carbs- replacing these with peanut butter or cheese may give a longer effect thus avoiding the nocturnal appetite.   3) recent  carb and sweet cravings are likely related to menopause/ peri menopause.   4) dream related sexual activity is not a sleep disorder, it is dream-enactment, non- threatening behavior. .     My Plan is to proceed with:  1) PSG with expanded EEG montage / a REM BD-  or HST to screen for apnea .    HST as the  patient wants to concentrate on apnea.   I would like to thank Dr Raoul Pitch for allowing me to meet with and to take care of this pleasant patient.   I plan to follow up either personally or through our NP within 2-3 month.   CC: I will share my notes with PCP.  Electronically signed by: Larey Seat, MD 12/05/2019 1:15 PM  [facility] Board certified by The [employer] of Sleep Medicine and Diplomate of the [employer] of Sleep Medicine. Board certified In Neurology through the [place], Fellow of the [employer] of Neurology. Medical Director of [employer].

## 2019-12-11 ENCOUNTER — Ambulatory Visit (INDEPENDENT_AMBULATORY_CARE_PROVIDER_SITE_OTHER): Payer: PRIVATE HEALTH INSURANCE

## 2019-12-11 ENCOUNTER — Other Ambulatory Visit: Payer: Self-pay | Admitting: Gastroenterology

## 2019-12-11 DIAGNOSIS — Z1159 Encounter for screening for other viral diseases: Secondary | ICD-10-CM

## 2019-12-12 LAB — SARS CORONAVIRUS 2 (TAT 6-24 HRS): SARS Coronavirus 2: NEGATIVE

## 2019-12-13 ENCOUNTER — Encounter: Payer: Self-pay | Admitting: Family Medicine

## 2019-12-13 ENCOUNTER — Ambulatory Visit (AMBULATORY_SURGERY_CENTER): Payer: PRIVATE HEALTH INSURANCE | Admitting: Gastroenterology

## 2019-12-13 ENCOUNTER — Other Ambulatory Visit: Payer: Self-pay

## 2019-12-13 ENCOUNTER — Ambulatory Visit (INDEPENDENT_AMBULATORY_CARE_PROVIDER_SITE_OTHER): Payer: PRIVATE HEALTH INSURANCE | Admitting: Family Medicine

## 2019-12-13 ENCOUNTER — Encounter: Payer: Self-pay | Admitting: Gastroenterology

## 2019-12-13 VITALS — BP 97/59 | HR 58 | Temp 97.4°F | Resp 13 | Ht 64.0 in | Wt 140.0 lb

## 2019-12-13 DIAGNOSIS — Z1211 Encounter for screening for malignant neoplasm of colon: Secondary | ICD-10-CM | POA: Diagnosis not present

## 2019-12-13 DIAGNOSIS — D120 Benign neoplasm of cecum: Secondary | ICD-10-CM | POA: Diagnosis not present

## 2019-12-13 DIAGNOSIS — M25521 Pain in right elbow: Secondary | ICD-10-CM | POA: Diagnosis not present

## 2019-12-13 MED ORDER — SODIUM CHLORIDE 0.9 % IV SOLN: 500.0000 mL | Freq: Once | INTRAVENOUS | Status: DC

## 2019-12-13 NOTE — Patient Instructions (Signed)
Handouts given:  Polyps Resume previous diet Continue current medications Await pathology results on 3 polyps    YOU HAD AN ENDOSCOPIC PROCEDURE TODAY AT [facility]:   Refer to the procedure report that was given to you for any specific questions about what was found during the examination.  If the procedure report does not answer your questions, please call your gastroenterologist to clarify.  If you requested that your care partner not be given the details of your procedure findings, then the procedure report has been included in a sealed envelope for you to review at your convenience later.  YOU SHOULD EXPECT: Some feelings of bloating in the abdomen. Passage of more gas than usual.  Walking can help get rid of the air that was put into your GI tract during the procedure and reduce the bloating. If you had a lower endoscopy (such as a colonoscopy or flexible sigmoidoscopy) you may notice spotting of blood in your stool or on the toilet paper. If you underwent a bowel prep for your procedure, you may not have a normal bowel movement for a few days.  Please Note:  You might notice some irritation and congestion in your nose or some drainage.  This is from the oxygen used during your procedure.  There is no need for concern and it should clear up in a day or so.  SYMPTOMS TO REPORT IMMEDIATELY:   Following lower endoscopy (colonoscopy or flexible sigmoidoscopy):  Excessive amounts of blood in the stool  Significant tenderness or worsening of abdominal pains  Swelling of the abdomen that is new, acute  Fever of 100F or higher    For urgent or emergent issues, a gastroenterologist can be reached at any hour by calling [phone].   DIET:  We do recommend a small meal at first, but then you may proceed to your regular diet.  Drink plenty of fluids but you should avoid alcoholic beverages for 24 hours.  ACTIVITY:  You should plan to take it easy for the rest of today  and you should NOT DRIVE or use heavy machinery until tomorrow (because of the sedation medicines used during the test).    FOLLOW UP: Our staff will call the number listed on your records 48-72 hours following your procedure to check on you and address any questions or concerns that you may have regarding the information given to you following your procedure. If we do not reach you, we will leave a message.  We will attempt to reach you two times.  During this call, we will ask if you have developed any symptoms of COVID 19. If you develop any symptoms (ie: fever, flu-like symptoms, shortness of breath, cough etc.) before then, please call [phone].  If you test positive for Covid 19 in the 2 weeks post procedure, please call and report this information to [place].    If any biopsies were taken you will be contacted by phone or by letter within the next 1-3 weeks.  Please call us at [phone] if you have not heard about the biopsies in 3 weeks.    SIGNATURES/CONFIDENTIALITY: You and/or your care partner have signed paperwork which will be entered into your electronic medical record.  These signatures attest to the fact that that the information above on your After Visit Summary has been reviewed and is understood.  Full responsibility of the confidentiality of this discharge information lies with you and/or your care-partner.

## 2019-12-13 NOTE — Progress Notes (Signed)
Temp JB  VS KA  Pt's states no medical or surgical changes since previsit or office visit.

## 2019-12-13 NOTE — Progress Notes (Signed)
Office Visit Note   Patient: Allison Collins           Date of Birth: 08/10/1971           MRN: [id number] Visit Date: 12/13/2019 Requested by: Ma Hillock, DO [address] [place],  [place] [address] PCP: Ma Hillock, DO  Subjective: Chief Complaint  Patient presents with  . Right Elbow - Pain    Elbow injection helped from 08/23/19. Pain returned 2 days ago. Cannot hold 5 lbs due to pain. Wakes her up.    HPI: She is here with recurrent right elbow pain.  Injection in September helped until 2 days ago.  This was her second injection in 1 year.  No injury, pain started in the same area as before with some radiation toward the wrist.  No numbness or tingling.              ROS:   All other systems were reviewed and are negative.  Objective: Vital Signs: There were no vitals taken for this visit.  Physical Exam:  General:  Alert and oriented, in no acute distress. Pulm:  Breathing unlabored. Psy:  Normal mood, congruent affect. Skin: No erythema or bruising. Right elbow: Full active range of motion.  Point tender at the common extensor tendon at the lateral epicondyle.  Also slightly tender at the radial tunnel.  No pain with wrist extension or third finger extension but she does have pain with forearm pronation and supination.  Imaging: None today  Assessment & Plan: 1.  Recurrent right elbow pain with probable lateral epicondylitis, cannot rule out radial tunnel syndrome. -We will reinject today per patient request.  Referral to physical therapy in [facility].  If symptoms persist, consider evaluation of the radial tunnel.     Procedures: Right elbow injection: After sterile prep with Betadine, injected 3 cc 1% lidocaine without epinephrine and 40 mg methylprednisolone into the area of maximum tenderness at the common extensor tendon.   PMFS History: Patient Active Problem List   Diagnosis Date Noted  . Snoring 11/08/2019  . Encounter for preventive health  examination 11/08/2019  . Depression, major, single episode, mild ([facility]) 09/12/2019   Past Medical History:  Diagnosis Date  . Allergy   . Chicken pox   . Depression     Family History  Problem Relation Age of Onset  . Drug abuse Mother   . Early death Mother 23  . Stroke Mother   . Pulmonary fibrosis Mother        passed away from condition  . Hearing loss Father   . Alzheimer's disease Maternal Grandmother   . Colon cancer Maternal Grandfather   . Colon polyps Neg Hx   . Esophageal cancer Neg Hx   . Rectal cancer Neg Hx   . Stomach cancer Neg Hx     Past Surgical History:  Procedure Laterality Date  . ABDOMINOPLASTY    . BREAST ENHANCEMENT SURGERY  2016   saline > 10 yrs with revision [id number] to silicone.   Marland Kitchen CESAREAN SECTION  2014   Social History   Occupational History  . Not on file  Tobacco Use  . Smoking status: Former Smoker    Types: Cigarettes    Quit date: 08/22/2005    Years since quitting: 14.3  . Smokeless tobacco: Never Used  Substance and Sexual Activity  . Alcohol use: Yes    Comment: rarely  . Drug use: Yes    Types: Marijuana  .  Sexual activity: Yes    Partners: Male

## 2019-12-13 NOTE — Op Note (Signed)
[facility] Patient Name: Allison Collins Procedure Date: 12/13/2019 7:54 AM MRN: [id number] Endoscopist: [place] Park MD, MD Age: 49 Referring MD:  Date of Birth: 22-Jun-1971 Gender: Female Account #: [id number] Procedure:                Colonoscopy Indications:              Screening for colorectal malignant neoplasm, This                            is the patient's first colonoscopy                           No first degree family members with colon cancer or                            polyps                           No second degree family members with colon or                            cancer before age 54 Medicines:                Monitored Anesthesia Care Procedure:                Pre-Anesthesia Assessment:                           - Prior to the procedure, a History and Physical                            was performed, and patient medications and                            allergies were reviewed. The patient's tolerance of                            previous anesthesia was also reviewed. The risks                            and benefits of the procedure and the sedation                            options and risks were discussed with the patient.                            All questions were answered, and informed consent                            was obtained. Prior Anticoagulants: The patient has                            taken no previous anticoagulant or antiplatelet  agents. ASA Grade Assessment: I - A normal, healthy                            patient. After reviewing the risks and benefits,                            the patient was deemed in satisfactory condition to                            undergo the procedure.                           After obtaining informed consent, the colonoscope                            was passed under direct vision. Throughout the                            procedure, the patient's blood  pressure, pulse, and                            oxygen saturations were monitored continuously. The                            Colonoscope was introduced through the anus and                            advanced to the the cecum, identified by                            appendiceal orifice and ileocecal valve. A second                            forward view of the right colon was performed. The                            colonoscopy was performed with difficulty due to                            restricted mobility of the colon, significant                            looping and a tortuous colon. The rectosigmoid                            junction is fixed. Successful completion of the                            procedure was aided by applying abdominal pressure.                            The patient tolerated the procedure well. The  quality of the bowel preparation was good. The                            ileocecal valve, appendiceal orifice, and rectum                            were photographed. Scope In: 8:06:02 AM Scope Out: 8:24:27 AM Scope Withdrawal Time: 0 hours 13 minutes 12 seconds  Total Procedure Duration: 0 hours 18 minutes 25 seconds  Findings:                 The perianal and digital rectal examinations were                            normal.                           Three sessile polyps were found in the cecum. The                            polyps were less than 1 mm in size. These polyps                            were removed with a cold snare in a piecemeal                            fashion. Resection and retrieval of at least 2                            polyps were complete. Estimated blood loss: none.                           The exam was otherwise without abnormality on                            direct and retroflexion views. Complications:            No immediate complications. Estimated blood loss:                             None. Estimated Blood Loss:     Estimated blood loss: none. Impression:               - Three less than 1 mm polyps in the cecum, removed                            with a cold snare. Resected and retrieved.                           - The examination was otherwise normal on direct                            and retroflexion views. Recommendation:           - Patient has a contact number available for  emergencies. The signs and symptoms of potential                            delayed complications were discussed with the                            patient. Return to normal activities tomorrow.                            Written discharge instructions were provided to the                            patient.                           - Resume previous diet.                           - Continue present medications.                           - Await pathology results.                           - Repeat colonoscopy in 3 years for surveillance if                            all 3 polyps are adenomas. Otherwise, will tailor                            recommendations based on the pathology results. [place] Park MD, MD 12/13/2019 8:31:05 AM This report has been signed electronically.

## 2019-12-13 NOTE — Progress Notes (Signed)
Called to room to assist during endoscopic procedure.  Patient ID and intended procedure confirmed with present staff. Received instructions for my participation in the procedure from the performing physician.  

## 2019-12-13 NOTE — Progress Notes (Signed)
Pt tolerated well. VSS. Awake and to recovery. 

## 2019-12-15 ENCOUNTER — Telehealth: Payer: Self-pay | Admitting: *Deleted

## 2019-12-15 ENCOUNTER — Other Ambulatory Visit: Payer: Self-pay

## 2019-12-15 ENCOUNTER — Telehealth: Payer: Self-pay

## 2019-12-15 NOTE — Telephone Encounter (Signed)
LVM

## 2019-12-15 NOTE — Telephone Encounter (Signed)
  Follow up Call-  Call back number 12/13/2019  Post procedure Call Back phone  # [phone]  Permission to leave phone message Yes     Patient questions:  Do you have a fever, pain , or abdominal swelling? No. Pain Score  0 *  Have you tolerated food without any problems? Yes.    Have you been able to return to your normal activities? Yes.    Do you have any questions about your discharge instructions: Diet   No. Medications  No. Follow up visit  No.  Do you have questions or concerns about your Care? No.  Actions: * If pain score is 4 or above: No action needed, pain <4.  1. Have you developed a fever since your procedure? no  2.   Have you had an respiratory symptoms (SOB or cough) since your procedure? no  3.   Have you tested positive for COVID 19 since your procedure no  4.   Have you had any family members/close contacts diagnosed with the COVID 19 since your procedure?  no   If yes to any of these questions please route to Joylene John, RN and Alphonsa Gin, Therapist, sports.

## 2019-12-18 ENCOUNTER — Ambulatory Visit: Payer: PRIVATE HEALTH INSURANCE | Admitting: Women's Health

## 2019-12-18 ENCOUNTER — Encounter: Payer: Self-pay | Admitting: Gastroenterology

## 2019-12-18 ENCOUNTER — Ambulatory Visit (INDEPENDENT_AMBULATORY_CARE_PROVIDER_SITE_OTHER): Payer: PRIVATE HEALTH INSURANCE | Admitting: Neurology

## 2019-12-18 ENCOUNTER — Other Ambulatory Visit: Payer: Self-pay

## 2019-12-18 ENCOUNTER — Encounter: Payer: Self-pay | Admitting: Women's Health

## 2019-12-18 VITALS — BP 110/78 | Ht 64.0 in | Wt 140.0 lb

## 2019-12-18 DIAGNOSIS — J3089 Other allergic rhinitis: Secondary | ICD-10-CM

## 2019-12-18 DIAGNOSIS — G4733 Obstructive sleep apnea (adult) (pediatric): Secondary | ICD-10-CM

## 2019-12-18 DIAGNOSIS — Z01419 Encounter for gynecological examination (general) (routine) without abnormal findings: Secondary | ICD-10-CM | POA: Diagnosis not present

## 2019-12-18 DIAGNOSIS — R0683 Snoring: Secondary | ICD-10-CM

## 2019-12-18 DIAGNOSIS — G4752 REM sleep behavior disorder: Secondary | ICD-10-CM

## 2019-12-18 NOTE — Progress Notes (Signed)
Allison Collins July 29, 1971 [id number]    History:  49 yo MWF G1P0 presents for annual exam with no complaints. Normal monthly cycle. Sexually active, no desire for contraception. Reports hot flashes. Denies vaginal dryness, urinary symptoms, discharge, abdominal pain. Normal pap and mammogram history. 12/20202 Colonoscopy with 3 polyps, follow up 3 years.   Past medical history, past surgical history, family history and social history were all reviewed and documented in the EPIC chart.  Homemaker.  Grandfather deceased from colon cancer. Healthy 51 yo son. 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  ROS:  A ROS was performed and pertinent positives and negatives are included.  Exam:  Vitals:   12/18/19 1132  BP: 110/78  Weight: 140 lb (63.5 kg)  Height: 5\' 4"  (1.626 m)   Body mass index is 24.03 kg/m.   General appearance:  Normal Thyroid:  Symmetrical, normal in size, without palpable masses or nodularity. Respiratory  Auscultation:  Clear without wheezing or rhonchi Cardiovascular  Auscultation:  Regular rate, without rubs, murmurs or gallops  Edema/varicosities:  Not grossly evident Abdominal  Soft,nontender, without masses, guarding or rebound. Abdominoplasty scar well-healed .   Liver/spleen:  No organomegaly noted  Hernia:  None appreciated  Skin  Inspection:  Grossly normal   Breasts: Examined lying and sitting. Bilateral implants    Right: Without masses, discharge or axillary adenopathy. Implant. Retraction lower areola since implant      surgery 49 years ago, no change.     Left: Without masses, retractions, discharge or axillary adenopathy. Implant.  Gentitourinary   Inguinal/mons:  Normal without inguinal adenopathy  External genitalia:  Normal  BUS/Urethra/Skene's glands:  Normal  Vagina:  Normal  Cervix:  Normal  Uterus:  normal in size, shape and contour.  Midline and mobile  Adnexa/parametria:      Rt: Without masses or tenderness.   Lt: Without masses or tenderness.  Anus and perineum: Normal    Assessment/Plan:  49 y.o. MWF G1P0  for annual exam with no complaints.   Monthly cycle/no contraception conception okay Anxiety/depression stable on Prozac per primary care Labs-primary care  Plan: Continue with scheduled mammogram, new guidelines reviewed,  Pap with HR HPV typing today.  Recommended vitamin D 2000 daily.  Continue healthy lifestyle of regular daily walking, biking and hiking.    [place], 1:07 PM 12/18/2019

## 2019-12-18 NOTE — Patient Instructions (Signed)
Good to meet you! Vit D 2000 iu daily Health Maintenance, Female Adopting a healthy lifestyle and getting preventive care are important in promoting health and wellness. Ask your health care provider about:  The right schedule for you to have regular tests and exams.  Things you can do on your own to prevent diseases and keep yourself healthy. What should I know about diet, weight, and exercise? Eat a healthy diet   Eat a diet that includes plenty of vegetables, fruits, low-fat dairy products, and lean protein.  Do not eat a lot of foods that are high in solid fats, added sugars, or sodium. Maintain a healthy weight Body mass index (BMI) is used to identify weight problems. It estimates body fat based on height and weight. Your health care provider can help determine your BMI and help you achieve or maintain a healthy weight. Get regular exercise Get regular exercise. This is one of the most important things you can do for your health. Most adults should:  Exercise for at least 150 minutes each week. The exercise should increase your heart rate and make you sweat (moderate-intensity exercise).  Do strengthening exercises at least twice a week. This is in addition to the moderate-intensity exercise.  Spend less time sitting. Even light physical activity can be beneficial. Watch cholesterol and blood lipids Have your blood tested for lipids and cholesterol at 51 years of age, then have this test every 5 years. Have your cholesterol levels checked more often if:  Your lipid or cholesterol levels are high.  You are older than 51 years of age.  You are at high risk for heart disease. What should I know about cancer screening? Depending on your health history and family history, you may need to have cancer screening at various ages. This may include screening for:  Breast cancer.  Cervical cancer.  Colorectal cancer.  Skin cancer.  Lung cancer. What should I know about heart  disease, diabetes, and high blood pressure? Blood pressure and heart disease  High blood pressure causes heart disease and increases the risk of stroke. This is more likely to develop in people who have high blood pressure readings, are of African descent, or are overweight.  Have your blood pressure checked: ? Every 3-5 years if you are 44-61 years of age. ? Every year if you are 81 years old or older. Diabetes Have regular diabetes screenings. This checks your fasting blood sugar level. Have the screening done:  Once every three years after age 3 if you are at a normal weight and have a low risk for diabetes.  More often and at a younger age if you are overweight or have a high risk for diabetes. What should I know about preventing infection? Hepatitis B If you have a higher risk for hepatitis B, you should be screened for this virus. Talk with your health care provider to find out if you are at risk for hepatitis B infection. Hepatitis C Testing is recommended for:  Everyone born from [birth year] through 1967.  Anyone with known risk factors for hepatitis C. Sexually transmitted infections (STIs)  Get screened for STIs, including gonorrhea and chlamydia, if: ? You are sexually active and are younger than 51 years of age. ? You are older than 51 years of age and your health care provider tells you that you are at risk for this type of infection. ? Your sexual activity has changed since you were last screened, and you are at increased risk for  chlamydia or gonorrhea. Ask your health care provider if you are at risk.  Ask your health care provider about whether you are at high risk for HIV. Your health care provider may recommend a prescription medicine to help prevent HIV infection. If you choose to take medicine to prevent HIV, you should first get tested for HIV. You should then be tested every 3 months for as long as you are taking the medicine. Pregnancy  If you are about to stop  having your period (premenopausal) and you may become pregnant, seek counseling before you get pregnant.  Take 400 to 800 micrograms (mcg) of folic acid every day if you become pregnant.  Ask for birth control (contraception) if you want to prevent pregnancy. Osteoporosis and menopause Osteoporosis is a disease in which the bones lose minerals and strength with aging. This can result in bone fractures. If you are 64 years old or older, or if you are at risk for osteoporosis and fractures, ask your health care provider if you should:  Be screened for bone loss.  Take a calcium or vitamin D supplement to lower your risk of fractures.  Be given hormone replacement therapy (HRT) to treat symptoms of menopause. Follow these instructions at home: Lifestyle  Do not use any products that contain nicotine or tobacco, such as cigarettes, e-cigarettes, and chewing tobacco. If you need help quitting, ask your health care provider.  Do not use street drugs.  Do not share needles.  Ask your health care provider for help if you need support or information about quitting drugs. Alcohol use  Do not drink alcohol if: ? Your health care provider tells you not to drink. ? You are pregnant, may be pregnant, or are planning to become pregnant.  If you drink alcohol: ? Limit how much you use to 0-1 drink a day. ? Limit intake if you are breastfeeding.  Be aware of how much alcohol is in your drink. In the [place] one drink equals one 12 oz bottle of beer (355 mL), one 5 oz glass of wine (148 mL), or one 1 oz glass of hard liquor (44 mL). General instructions  Schedule regular health, dental, and eye exams.  Stay current with your vaccines.  Tell your health care provider if: ? You often feel depressed. ? You have ever been abused or do not feel safe at home. Summary  Adopting a healthy lifestyle and getting preventive care are important in promoting health and wellness.  Follow your health  care provider's instructions about healthy diet, exercising, and getting tested or screened for diseases.  Follow your health care provider's instructions on monitoring your cholesterol and blood pressure. This information is not intended to replace advice given to you by your health care provider. Make sure you discuss any questions you have with your health care provider. Document Revised: 11/16/2018 Document Reviewed: 11/16/2018 Elsevier Patient Education  2020 [employer].

## 2019-12-18 NOTE — Progress Notes (Signed)
L 

## 2019-12-20 LAB — PAP IG W/ RFLX HPV ASCU

## 2019-12-22 ENCOUNTER — Encounter: Payer: Self-pay | Admitting: Neurology

## 2019-12-27 ENCOUNTER — Encounter: Payer: Self-pay | Admitting: Family Medicine

## 2019-12-27 DIAGNOSIS — G4752 REM sleep behavior disorder: Secondary | ICD-10-CM | POA: Insufficient documentation

## 2019-12-27 DIAGNOSIS — G4730 Sleep apnea, unspecified: Secondary | ICD-10-CM | POA: Insufficient documentation

## 2019-12-27 DIAGNOSIS — J3089 Other allergic rhinitis: Secondary | ICD-10-CM | POA: Insufficient documentation

## 2019-12-27 DIAGNOSIS — R0683 Snoring: Secondary | ICD-10-CM | POA: Insufficient documentation

## 2019-12-27 NOTE — Addendum Note (Signed)
Addended by: Larey Seat on: 12/27/2019 05:07 PM   Modules accepted: Orders

## 2019-12-27 NOTE — Procedures (Signed)
  Patient Information     First Name: Allison Last Name: Collins ID: [id number]  Birth Date: 1971-04-28 Age: 49 Gender: Female  Referring Provider Raoul Pitch, Renee A, DO BMI: 26.2 (W=148 lb, H=5' 3'')  Neck Circ.:  14 '' Epworth:  6/24   Sleep Study Information    Study Date: Dec 18, 2019 S/H/A Version: 001.001.001.001 / 4.1.1528 / 77  History:    [name] is a right -handed Ashkenazi- [place] female with a possible sleep disorder. She has a past medical history of Allergy, Chicken pox, and Depression. Sleep relevant medical history: loud snoring wakes her husband up, she has been eating at about 3 AM at night, but has recollection of these frequent events. She also performed complex movements in her sleep, according to her husband's observation.        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 This mild apnea is only evident in supine sleep position.  Recommendations:     The patient should be able to avoid supine sleep in order to control apnea. Unfortunately, her HST cannot help [place] to determine if there is any parasomnia activity present. This will require an attended sleep study with an EEG montage. Please offer the patient that we can order the attended sleep study now -if she agrees.    Interpreting Physician: Larey Seat, MD            Sleep Summary    Oxygen Saturation Statistics     Start Study Time: End Study Time: Total Recording Time:  9:54:59 PM 7:59:00 AM 10 h, 4 min  Total Sleep Time % REM of Sleep Time:  8 h, 32 min 25.8    Mean: 96 Minimum: 88 Maximum: 100  Mean of Desaturations Nadirs (%):   93  Oxygen Desaturation. %:   4-9 10-20 >20 Total  Events Number Total    35  2 94.6 5.4  0 0.0  37 100.0  Oxygen Saturation: <90 <=88 <85 <80 <70  Duration (minutes): Sleep % 0.2 0.0  0.0 0.0  0.0 0.0 0.0 0.0 0.0 0.0     Respiratory Indices        Total Events REM NREM All Night  pRDI:  106  pAHI:  78 ODI:  37  pAHIc:  17  % CSR: 0.0 15.1 10.5 4.1 1.8 11.6 8.7 4.4 2.1 12.5 9.2 4.4 2.0       Pulse Rate Statistics during Sleep (BPM)      Mean:  61 Minimum: 48 Maximum: 90    Indices are calculated using technically valid sleep time of  8 h, 30 min. pRDI/pAHI are calculated using oxi desaturations ? 3%  Body Position Statistics  Position Supine Prone Right Left Non-Supine  Sleep (min) 177.0 44.5 90.0 200.5 335.0  Sleep % 34.6 8.7 17.6 39.2 65.4  pRDI 23.8 10.9 6.0 5.7 6.5  pAHI 19.4 6.8 3.3 3.3 3.8  ODI 10.6 2.7 1.3 0.6 1.1     Snoring Statistics Snoring Level (dB) >40 >50 >60 >70 >80 >Threshold (45)  Sleep (min) 127.0 28.5 6.3 0.0 0.0 52.6  Sleep % 24.8 5.6 1.2 0.0 0.0 10.3    Mean: 42 dB

## 2019-12-27 NOTE — Progress Notes (Signed)
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 This mild  apnea is only evident in supine sleep position.   Recommendations:    The patient should be able to avoid supine sleep in order to  control apnea. Unfortunately, her HST cannot help [place] to determine  if there is any parasomnia activity present. This will require an  attended sleep study with an EEG montage.  Please offer the patient that we can order the attended sleep  study now -if she agrees.    Interpreting Physician: Larey Seat, MD

## 2019-12-28 ENCOUNTER — Other Ambulatory Visit: Payer: Self-pay | Admitting: Neurology

## 2019-12-28 ENCOUNTER — Telehealth: Payer: Self-pay

## 2019-12-28 ENCOUNTER — Telehealth: Payer: Self-pay | Admitting: Neurology

## 2019-12-28 ENCOUNTER — Encounter: Payer: Self-pay | Admitting: Neurology

## 2019-12-28 DIAGNOSIS — R0683 Snoring: Secondary | ICD-10-CM

## 2019-12-28 DIAGNOSIS — G4733 Obstructive sleep apnea (adult) (pediatric): Secondary | ICD-10-CM

## 2019-12-28 NOTE — Telephone Encounter (Signed)
I called pt. I advised pt that Dr. Brett Fairy reviewed their sleep study results and found that pt has sleep apnea. Dr. Brett Fairy recommends that pt start auto CPAP. I reviewed PAP compliance expectations with the pt. Pt is agreeable to starting a CPAP. I advised pt that an order will be sent to a DME, [facility], and [facility] will call the pt within about one week after they file with the pt's insurance. [facility] will show the pt how to use the machine, fit for masks, and troubleshoot the CPAP if needed. A follow up appt was made for insurance purposes with Ward Givens, NP on March 25,2021 at 11 am. Pt verbalized understanding to arrive 15 minutes early and bring their CPAP. A letter with all of this information in it will be mailed to the pt as a reminder. I verified with the pt that the address we have on file is correct. Pt verbalized understanding of results. Pt had no questions at this time but was encouraged to call back if questions arise. I have sent the order to [facility] and have received confirmation that they have received the order.

## 2019-12-28 NOTE — Telephone Encounter (Signed)
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 This mild  apnea is only evident in supine sleep position.   Recommendations:    The patient should be able to avoid supine sleep in order to  control apnea. Unfortunately, her HST cannot help [place] to determine  if there is any parasomnia activity present. This will require an  attended sleep study with an EEG montage.  Please offer the patient that we can order the attended sleep  study now -if she agrees.    Interpreting Physician: Larey Seat, MD

## 2019-12-28 NOTE — Telephone Encounter (Signed)
Per Sleep lab manager, Shirlean Mylar, Insurance will not pay for in lab sleep study since HST came back positive for OSA. MD can order auto CPAP for patient to get started on. Once treatment has started, pt may come back for in lab study if parasomnia symptoms persist.

## 2020-01-04 ENCOUNTER — Other Ambulatory Visit: Payer: Self-pay

## 2020-01-04 ENCOUNTER — Ambulatory Visit
Admission: RE | Admit: 2020-01-04 | Discharge: 2020-01-04 | Disposition: A | Discharge status: Home / Self Care | Payer: PRIVATE HEALTH INSURANCE | Source: Ambulatory Visit | Attending: Family Medicine | Admitting: Family Medicine

## 2020-01-04 DIAGNOSIS — Z1231 Encounter for screening mammogram for malignant neoplasm of breast: Secondary | ICD-10-CM

## 2020-01-17 ENCOUNTER — Encounter: Payer: Self-pay | Admitting: Adult Health

## 2020-01-18 ENCOUNTER — Encounter: Payer: Self-pay | Admitting: Family Medicine

## 2020-02-28 ENCOUNTER — Telehealth: Payer: Self-pay

## 2020-02-28 NOTE — Telephone Encounter (Signed)
Called [facility] to see if the pt is complaint with her CPAP machine. Pt never p/u the machine.  Called pt to inform her that her appt tomorrow 02/29/20 at 11 is for a CPAP compliance f/u and that we can r/s her appt. Pt refused r/s.  I explained that she will need to have been complaint with the machine in order for the provider to see her for the appointment.  She states she is p/u the CPAP from here and would like to keep her appointment.   I gave her [facility] phone number.

## 2020-02-29 ENCOUNTER — Ambulatory Visit: Payer: PRIVATE HEALTH INSURANCE | Admitting: Adult Health

## 2020-02-29 NOTE — Telephone Encounter (Signed)
Pt called back wanting to clear up why she was called to r/s. I explained to pt that she would need to call [facility] to get the cpap from them and once she received it then we can schedule her initial cpap appt. Pt understood and stated she was going to call them to schedule a pick up. Appt has been canceled till further notice.

## 2020-07-12 ENCOUNTER — Encounter: Payer: Self-pay | Admitting: Family Medicine

## 2020-07-25 ENCOUNTER — Ambulatory Visit (INDEPENDENT_AMBULATORY_CARE_PROVIDER_SITE_OTHER): Payer: PRIVATE HEALTH INSURANCE | Admitting: Family Medicine

## 2020-07-25 ENCOUNTER — Ambulatory Visit (INDEPENDENT_AMBULATORY_CARE_PROVIDER_SITE_OTHER): Payer: PRIVATE HEALTH INSURANCE

## 2020-07-25 ENCOUNTER — Other Ambulatory Visit: Payer: Self-pay

## 2020-07-25 ENCOUNTER — Encounter: Payer: Self-pay | Admitting: Family Medicine

## 2020-07-25 DIAGNOSIS — M25511 Pain in right shoulder: Secondary | ICD-10-CM

## 2020-07-25 NOTE — Progress Notes (Signed)
Started cross fit 6 weeks ago Pain in the shoulder started 5 weeks ago Not a chronic pain Pain only when active

## 2020-07-25 NOTE — Progress Notes (Signed)
Office Visit Note   Patient: Allison Collins           Date of Birth: [birth year]           MRN: [id number] Visit Date: 07/25/2020 Requested by: Ma Hillock, DO [address] [place],  [place] [address] PCP: Ma Hillock, DO  Subjective: Chief Complaint  Patient presents with   Right Shoulder - Pain    HPI: She is here with right shoulder pain.  Symptoms started about a month ago, no definite injury.  She has been doing CrossFit exercises and enjoys it.  1 day she woke up with pain in her shoulder and has been hurting since then, mainly when doing overhead activities.  Is not constant pain.  She is not taking medication for it.  She is right-hand dominant, no previous problems with her shoulder.  Pain seems to be mostly on the lateral aspect.              ROS:   All other systems were reviewed and are negative.  Objective: Vital Signs: There were no vitals taken for this visit.  Physical Exam:  General:  Alert and oriented, in no acute distress. Pulm:  Breathing unlabored. Psy:  Normal mood, congruent affect. Skin: No erythema Right shoulder: Full active range of motion compared to the left.  Occasional popping in the shoulder but not a lot of crepitus.  No significant AC joint tenderness, negative crossover test.  Rotator cuff strength testing is 5/5 throughout.  She does have a little bit of pain with internal rotation.  Speeds test is negative, no tenderness at the long head biceps tendon.  No significant subacromial tenderness.  Imaging: XR Shoulder Right  Result Date: 07/25/2020 X-rays of the right shoulder reveal normal anatomic alignment.  There is very early degenerative change at the [facility] joint.  Glenohumeral joint looks good, no soft tissue calcifications.  No sign of neoplasm.   Assessment & Plan: 1.  Right shoulder pain, suspect impingement -We will try physical therapy, ibuprofen or Aleve as needed.  If symptoms persist could contemplate subacromial  injection.  Follow-up as needed.     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   Non-seasonal allergic rhinitis 12/27/2019   Loud snoring 12/27/2019   Sleep apnea 12/27/2019   Snoring 11/08/2019   Encounter for preventive health examination 11/08/2019   Depression, major, single episode, mild ([place]) 09/12/2019   Past Medical History:  Diagnosis Date   Allergy    Chicken pox    Depression     Family History  Problem Relation Age of Onset   Drug abuse Mother    Early death Mother 48   Stroke Mother    Pulmonary fibrosis Mother        passed away from condition   Hearing loss Father    Alzheimer's disease Maternal Grandmother    Breast cancer Maternal Grandmother    Colon cancer Maternal Grandfather    Colon polyps Neg Hx    Esophageal cancer Neg Hx    Rectal cancer Neg Hx    Stomach cancer Neg Hx     Past Surgical History:  Procedure Laterality Date   ABDOMINOPLASTY     AUGMENTATION MAMMAPLASTY Bilateral    BREAST ENHANCEMENT SURGERY  2016   saline > 10 yrs with revision 1583 to silicone.  CESAREAN SECTION  2014   Social History   Occupational History   Not on file  Tobacco Use   Smoking status: Former Smoker    Types: Cigarettes    Quit date: 08/22/2005    Years since quitting: 14.9   Smokeless tobacco: Never Used  Vaping Use   Vaping Use: Never used  Substance and Sexual Activity   Alcohol use: Yes    Comment: rarely   Drug use: Yes    Types: Marijuana   Sexual activity: Yes    Partners: Male    Birth control/protection: None

## 2020-07-25 NOTE — Patient Instructions (Signed)
    For 1-2 weeks, would suggest taking either ibuprofen or aleve:  Ibuprofen:  600 - 800 mg three times daily  Aleve:  440 mg twice daily

## 2020-10-07 ENCOUNTER — Encounter: Payer: Self-pay | Admitting: Family Medicine

## 2020-10-14 MED ORDER — FLUOXETINE HCL 20 MG PO CAPS: 20.0000 mg | ORAL_CAPSULE | Freq: Every day | ORAL | 0 refills | Status: DC

## 2020-10-14 NOTE — Telephone Encounter (Signed)
Patient arrived in the office this morning around 8:30AM.  Patient thought she had made an appt with Dr. Raoul Pitch today at 8:30AM. There were no scheduled appts on the appt desk for her or on her mychart. I scheduled patient an appt with Dr. Raoul Pitch on Friday 11/12 at [facility].  Patient said she will need meds before then.  FLUoxetine (PROZAC) 20 MG capsule [[id number]]     [facility]

## 2020-10-14 NOTE — Telephone Encounter (Signed)
30 days sent. Must keep appt for any refills

## 2020-10-18 ENCOUNTER — Other Ambulatory Visit: Payer: Self-pay

## 2020-10-18 ENCOUNTER — Ambulatory Visit: Payer: PRIVATE HEALTH INSURANCE | Admitting: Family Medicine

## 2020-10-18 ENCOUNTER — Encounter: Payer: Self-pay | Admitting: Family Medicine

## 2020-10-18 VITALS — BP 96/62 | HR 60 | Temp 97.5°F | Ht 64.0 in | Wt 143.0 lb

## 2020-10-18 DIAGNOSIS — Z23 Encounter for immunization: Secondary | ICD-10-CM

## 2020-10-18 DIAGNOSIS — F320 Major depressive disorder, single episode, mild: Secondary | ICD-10-CM | POA: Diagnosis not present

## 2020-10-18 MED ORDER — FLUOXETINE HCL 20 MG PO CAPS: 20.0000 mg | ORAL_CAPSULE | Freq: Every day | ORAL | 1 refills | Status: DC

## 2020-10-18 NOTE — Progress Notes (Signed)
Patient ID: Allison Collins, female  DOB: Aug 19, 1971, 49 y.o.   MRN: [id number] Patient Care Team    Relationship Specialty Notifications Start End  Ma Hillock, DO PCP - General Family Medicine  09/12/19     Chief Complaint  Patient presents with  . Follow-up    [facility]; pt is fasting    Subjective:  Allison Collins is a 49 y.o.  female present for depression:  Pt reports she has been prescribed prozac 20 mg qd since 2009 for depression. She reports she is doing great on medication and would like refills.   Depression screen [facility] 2/9 11/08/2019 09/12/2019  Decreased Interest 0 0  Down, Depressed, Hopeless 0 0  PHQ - 2 Score 0 0  Altered sleeping 0 0  Tired, decreased energy 0 1  Change in appetite 0 0  Feeling bad or failure about yourself  0 2  Trouble concentrating 0 3  Moving slowly or fidgety/restless 0 0  Suicidal thoughts 0 0  PHQ-9 Score 0 6  Difficult doing work/chores Not difficult at all Extremely dIfficult   GAD 7 : Generalized Anxiety Score 09/12/2019  Nervous, Anxious, on Edge 0  Control/stop worrying 0  Worry too much - different things 0  Trouble relaxing 3  Easily annoyed or irritable 3  Afraid - awful might happen 0  Anxiety Difficulty Extremely difficult       No flowsheet data found.  Immunization History  Administered Date(s) Administered  . Influenza,inj,Quad PF,6+ Mos 09/12/2019  . Janssen (J&J) SARS-COV-2 Vaccination 08/21/2020  . Tdap 11/08/2019  . Zoster Recombinat (Shingrix) 11/08/2019   No exam data present  Past Medical History:  Diagnosis Date  . Allergy   . Chicken pox   . Depression    No Known Allergies Past Surgical History:  Procedure Laterality Date  . ABDOMINOPLASTY    . AUGMENTATION MAMMAPLASTY Bilateral   . BREAST ENHANCEMENT SURGERY  2016   saline > 10 yrs with revision 0923 to silicone.   Marland Kitchen CESAREAN SECTION  2014   Family History  Problem Relation Age of Onset  . Drug abuse Mother    . Early death Mother 5  . Stroke Mother   . Pulmonary fibrosis Mother        passed away from condition  . Hearing loss Father   . Alzheimer's disease Maternal Grandmother   . Breast cancer Maternal Grandmother   . Colon cancer Maternal Grandfather   . Colon polyps Neg Hx   . Esophageal cancer Neg Hx   . Rectal cancer Neg Hx   . Stomach cancer Neg Hx    Social History   Social History Narrative   Marital status/children/pets: Married, 1 child   Education/employment: college, homemaker   Safety:      -smoke alarm in the home:Yes     - wears seatbelt: Yes     - Feels safe in their relationships: Yes    Allergies as of 10/18/2020   No Known Allergies     Medication List       Accurate as of October 18, 2020  8:09 AM. If you have any questions, ask your nurse or doctor.        STOP taking these medications   Adderall XR 30 MG 24 hr capsule Generic drug: amphetamine-dextroamphetamine Stopped by: Howard Pouch, DO     TAKE these medications   BIOTIN PO Take by mouth daily.   FLUoxetine 20 MG capsule Commonly  known as: PROZAC Take 1 capsule (20 mg total) by mouth daily.   multivitamin tablet Take 1 tablet by mouth daily.   nicotine polacrilex 4 MG gum Commonly known as: NICORETTE Take 4 mg by mouth as needed for smoking cessation.       All past medical history, surgical history, allergies, family history, immunizations andmedications were updated in the EMR today and reviewed under the history and medication portions of their EMR.    No results found for this or any previous visit (from the past 2160 hour(s)).  Patient was never admitted.   ROS: 14 pt review of systems performed and negative (unless mentioned in an HPI)  Objective: BP 96/62   Pulse 60   Temp (!) 97.5 F (36.4 C) (Oral)   Ht 5\' 4"  (1.626 m)   Wt 143 lb (64.9 kg)   SpO2 98%   BMI 24.55 kg/m  Gen: Afebrile. No acute distress.  HENT: AT. [place].  Psych: Normal affect, dress and  demeanor. Normal speech. Normal thought content and judgment..     Assessment/plan: Allison Collins is a 49 y.o. female present for Depression, major, single episode, mild ([facility]) Stable.  Continue  prozac 20 mg QD.  F/u q 5.5 mos (can be CPE)  Need for influenza vaccination Influenza vaccine administered today   Meds ordered this encounter  Medications  . FLUoxetine (PROZAC) 20 MG capsule    Sig: Take 1 capsule (20 mg total) by mouth daily.    Dispense:  90 capsule    Refill:  1   Orders Placed This Encounter  Procedures  . Flu Vaccine QUAD 36+ mos IM     Note is dictated utilizing voice recognition software. Although note has been proof read prior to signing, occasional typographical errors still can be missed. If any questions arise, please do not hesitate to call for verification.  Electronically signed by: Howard Pouch, DO [place]

## 2020-10-18 NOTE — Patient Instructions (Signed)
Nice to see you today.  Next appt in 5.5 mos- can be your physical if desired.

## 2020-12-18 ENCOUNTER — Encounter: Payer: Self-pay | Admitting: Obstetrics and Gynecology

## 2020-12-18 ENCOUNTER — Other Ambulatory Visit: Payer: Self-pay

## 2020-12-18 ENCOUNTER — Ambulatory Visit: Payer: PRIVATE HEALTH INSURANCE | Admitting: Obstetrics and Gynecology

## 2020-12-18 VITALS — BP 118/78 | Ht 64.0 in | Wt 143.0 lb

## 2020-12-18 DIAGNOSIS — N951 Menopausal and female climacteric states: Secondary | ICD-10-CM

## 2020-12-18 DIAGNOSIS — Z01419 Encounter for gynecological examination (general) (routine) without abnormal findings: Secondary | ICD-10-CM | POA: Diagnosis not present

## 2020-12-18 NOTE — Progress Notes (Signed)
   Allison Collins [birth year] [id number]  SUBJECTIVE:  50 y.o. [id number] female for annual routine gynecologic exam. She has no gynecologic concerns.  Usual perimenopausal type symptoms.  Current Outpatient Medications  Medication Sig Dispense Refill  . BIOTIN PO Take by mouth daily.    Marland Kitchen FLUoxetine (PROZAC) 20 MG capsule Take 1 capsule (20 mg total) by mouth daily. 90 capsule 1  . Multiple Vitamin (MULTIVITAMIN) tablet Take 1 tablet by mouth daily.    . nicotine polacrilex (NICORETTE) 4 MG gum Take 4 mg by mouth as needed for smoking cessation.     No current facility-administered medications for this visit.   Allergies: Patient has no known allergies.  Patient's last menstrual period was 12/12/2020.  Past medical history,surgical history, problem list, medications, allergies, family history and social history were all reviewed and documented as reviewed in the EPIC chart.  ROS: Pertinent positives and negatives as reviewed in HPI.   OBJECTIVE:  BP 118/78 (BP Location: Right Arm, Patient Position: Sitting, Cuff Size: Normal)   Ht 5\' 4"  (1.626 m)   Wt 143 lb (64.9 kg)   LMP 12/12/2020   BMI 24.55 kg/m  The patient appears well, alert, oriented, in no distress.  BREAST EXAM: breasts appear normal, no suspicious masses, no skin or nipple changes or axillary nodes, bilateral implants noted, retraction of right lower areola since her prior implant surgery,  PELVIC EXAM: VULVA: normal appearing vulva with no masses, tenderness or lesions, VAGINA: normal appearing vagina with normal color and discharge, no lesions, CERVIX: normal appearing cervix without discharge or lesions, UTERUS: uterus is normal size, shape, consistency and nontender, ADNEXA: normal adnexa in size, nontender and no masses  Chaperone: Wandra Scot Bonham present during the examination  ASSESSMENT:  50 y.o. [id number] here for annual gynecologic exam  PLAN:   1.  Perimenopause.  Mild hot flash symptoms  doing okay taking OTC supplement.  Typical perimenopausal bleeding changes, with occasional episode of week long spotting, some skipping of periods.  Discussed what to possibly expect with the transition.  No excessive bleeding, will call if any of those symptoms develop. 2. Pap smear 12/2019.  Denies history of abnormal Pap smears.  Next Pap smear due 2024 following the current guidelines recommending the 3 year interval. 3. Contraception.  Husband recently with open heart surgery, no contraception currently okay.  Will let us know if anything changes. 4. Mammogram 12/2019.  Breast exam normal.  She is in process of scheduling an annual mammogram. 5. Colonoscopy 12/2019. She will follow-up at the recommended interval per GI. 6. Health maintenance.  No labs today as she has these completed with her primary care doctor.    Return annually or sooner, prn.  Joseph Pierini MD 12/18/20

## 2020-12-19 ENCOUNTER — Encounter: Payer: PRIVATE HEALTH INSURANCE | Admitting: Nurse Practitioner

## 2021-01-06 ENCOUNTER — Other Ambulatory Visit: Payer: Self-pay

## 2021-01-06 ENCOUNTER — Other Ambulatory Visit: Payer: PRIVATE HEALTH INSURANCE

## 2021-01-06 DIAGNOSIS — Z20822 Contact with and (suspected) exposure to covid-19: Secondary | ICD-10-CM

## 2021-01-08 LAB — SARS-COV-2, NAA 2 DAY TAT

## 2021-01-08 LAB — NOVEL CORONAVIRUS, NAA: SARS-CoV-2, NAA: NOT DETECTED

## 2021-01-29 ENCOUNTER — Encounter: Payer: Self-pay | Admitting: Family Medicine

## 2021-01-29 ENCOUNTER — Ambulatory Visit (INDEPENDENT_AMBULATORY_CARE_PROVIDER_SITE_OTHER): Payer: PRIVATE HEALTH INSURANCE | Admitting: Family Medicine

## 2021-01-29 ENCOUNTER — Other Ambulatory Visit: Payer: Self-pay

## 2021-01-29 VITALS — BP 96/54 | HR 54 | Temp 98.0°F | Resp 16 | Ht 64.57 in | Wt 141.6 lb

## 2021-01-29 DIAGNOSIS — Z13220 Encounter for screening for lipoid disorders: Secondary | ICD-10-CM

## 2021-01-29 DIAGNOSIS — Z79899 Other long term (current) drug therapy: Secondary | ICD-10-CM

## 2021-01-29 DIAGNOSIS — Z0000 Encounter for general adult medical examination without abnormal findings: Secondary | ICD-10-CM

## 2021-01-29 DIAGNOSIS — Z131 Encounter for screening for diabetes mellitus: Secondary | ICD-10-CM

## 2021-01-29 DIAGNOSIS — Z23 Encounter for immunization: Secondary | ICD-10-CM

## 2021-01-29 DIAGNOSIS — Z136 Encounter for screening for cardiovascular disorders: Secondary | ICD-10-CM

## 2021-01-29 DIAGNOSIS — Z8249 Family history of ischemic heart disease and other diseases of the circulatory system: Secondary | ICD-10-CM

## 2021-01-29 DIAGNOSIS — F320 Major depressive disorder, single episode, mild: Secondary | ICD-10-CM | POA: Diagnosis not present

## 2021-01-29 DIAGNOSIS — Z130 Encounter for screening for diseases of the blood and blood-forming organs and certain disorders involving the immune mechanism: Secondary | ICD-10-CM

## 2021-01-29 DIAGNOSIS — Z1159 Encounter for screening for other viral diseases: Secondary | ICD-10-CM

## 2021-01-29 DIAGNOSIS — Z5320 Procedure and treatment not carried out because of patient's decision for unspecified reasons: Secondary | ICD-10-CM

## 2021-01-29 DIAGNOSIS — Z1231 Encounter for screening mammogram for malignant neoplasm of breast: Secondary | ICD-10-CM

## 2021-01-29 DIAGNOSIS — D171 Benign lipomatous neoplasm of skin and subcutaneous tissue of trunk: Secondary | ICD-10-CM

## 2021-01-29 MED ORDER — FLUOXETINE HCL 20 MG PO CAPS: 20.0000 mg | ORAL_CAPSULE | Freq: Every day | ORAL | 1 refills | Status: DC

## 2021-01-29 NOTE — Progress Notes (Signed)
Heart work up- might have pulled a muscle when working out. Husband had open heart surgery and suggested to her to have heart check. Willing to have referral to cardiology.

## 2021-01-29 NOTE — Patient Instructions (Signed)
Lipoma  A lipoma is a noncancerous (benign) tumor that is made up of fat cells. This is a very common type of soft-tissue growth. Lipomas are usually found under the skin (subcutaneous). They may occur in any tissue of the body that contains fat. Common areas for lipomas to appear include the back, arms, shoulders, buttocks, and thighs. Lipomas grow slowly, and they are usually painless. Most lipomas do not cause problems and do not require treatment. What are the causes? The cause of this condition is not known. What increases the risk? You are more likely to develop this condition if:  You are 18-50 years old.  You have a family history of lipomas. What are the signs or symptoms? A lipoma usually appears as a small, round bump under the skin. In most cases, the lump will:  Feel soft or rubbery.  Not cause pain or other symptoms. However, if a lipoma is located in an area where it pushes on nerves, it can become painful or cause other symptoms. How is this diagnosed? A lipoma can usually be diagnosed with a physical exam. You may also have tests to confirm the diagnosis and to rule out other conditions. Tests may include:  Imaging tests, such as a CT scan or an MRI.  Removal of a tissue sample to be looked at under a microscope (biopsy). How is this treated? Treatment for this condition depends on the size of the lipoma and whether it is causing any symptoms.  For small lipomas that are not causing problems, no treatment is needed.  If a lipoma is bigger or it causes problems, surgery may be done to remove the lipoma. Lipomas can also be removed to improve appearance. Most often, the procedure is done after applying a medicine that numbs the area (local anesthetic).  Liposuction may be done to reduce the size of the lipoma before it is removed through surgery, or it may be done to remove the lipoma. Lipomas are removed with this method in order to limit incision size and scarring. A  liposuction tube is inserted through a small incision into the lipoma, and the contents of the lipoma are removed through the tube with suction. Follow these instructions at home:  Watch your lipoma for any changes.  Keep all follow-up visits as told by your health care provider. This is important. Contact a health care provider if:  Your lipoma becomes larger or hard.  Your lipoma becomes painful, red, or increasingly swollen. These could be signs of infection or a more serious condition. Get help right away if:  You develop tingling or numbness in an area near the lipoma. This could indicate that the lipoma is causing nerve damage. Summary  A lipoma is a noncancerous tumor that is made up of fat cells.  Most lipomas do not cause problems and do not require treatment.  If a lipoma is bigger or it causes problems, surgery may be done to remove the lipoma.  Contact a health care provider if your lipoma becomes larger or hard, or if it becomes painful, red, or increasingly swollen. Pain, redness, and swelling could be signs of infection or a more serious condition. This information is not intended to replace advice given to you by your health care provider. Make sure you discuss any questions you have with your health care provider. Document Revised: 07/10/2019 Document Reviewed: 07/10/2019 Elsevier Patient Education  [place] Maintenance, Female Adopting a healthy lifestyle and getting preventive care are important in  promoting health and wellness. Ask your health care provider about:  The right schedule for you to have regular tests and exams.  Things you can do on your own to prevent diseases and keep yourself healthy. What should I know about diet, weight, and exercise? Eat a healthy diet  Eat a diet that includes plenty of vegetables, fruits, low-fat dairy products, and lean protein.  Do not eat a lot of foods that are high in solid fats, added sugars, or  sodium.   Maintain a healthy weight Body mass index (BMI) is used to identify weight problems. It estimates body fat based on height and weight. Your health care provider can help determine your BMI and help you achieve or maintain a healthy weight. Get regular exercise Get regular exercise. This is one of the most important things you can do for your health. Most adults should:  Exercise for at least 150 minutes each week. The exercise should increase your heart rate and make you sweat (moderate-intensity exercise).  Do strengthening exercises at least twice a week. This is in addition to the moderate-intensity exercise.  Spend less time sitting. Even light physical activity can be beneficial. Watch cholesterol and blood lipids Have your blood tested for lipids and cholesterol at 52 years of age, then have this test every 5 years. Have your cholesterol levels checked more often if:  Your lipid or cholesterol levels are high.  You are older than 52 years of age.  You are at high risk for heart disease. What should I know about cancer screening? Depending on your health history and family history, you may need to have cancer screening at various ages. This may include screening for:  Breast cancer.  Cervical cancer.  Colorectal cancer.  Skin cancer.  Lung cancer. What should I know about heart disease, diabetes, and high blood pressure? Blood pressure and heart disease  High blood pressure causes heart disease and increases the risk of stroke. This is more likely to develop in people who have high blood pressure readings, are of African descent, or are overweight.  Have your blood pressure checked: ? Every 3-5 years if you are 57-57 years of age. ? Every year if you are 63 years old or older. Diabetes Have regular diabetes screenings. This checks your fasting blood sugar level. Have the screening done:  Once every three years after age 39 if you are at a normal weight and  have a low risk for diabetes.  More often and at a younger age if you are overweight or have a high risk for diabetes. What should I know about preventing infection? Hepatitis B If you have a higher risk for hepatitis B, you should be screened for this virus. Talk with your health care provider to find out if you are at risk for hepatitis B infection. Hepatitis C Testing is recommended for:  Everyone born from [birth year] through 1967.  Anyone with known risk factors for hepatitis C. Sexually transmitted infections (STIs)  Get screened for STIs, including gonorrhea and chlamydia, if: ? You are sexually active and are younger than 52 years of age. ? You are older than 52 years of age and your health care provider tells you that you are at risk for this type of infection. ? Your sexual activity has changed since you were last screened, and you are at increased risk for chlamydia or gonorrhea. Ask your health care provider if you are at risk.  Ask your health care provider about whether  you are at high risk for HIV. Your health care provider may recommend a prescription medicine to help prevent HIV infection. If you choose to take medicine to prevent HIV, you should first get tested for HIV. You should then be tested every 3 months for as long as you are taking the medicine. Pregnancy  If you are about to stop having your period (premenopausal) and you may become pregnant, seek counseling before you get pregnant.  Take 400 to 800 micrograms (mcg) of folic acid every day if you become pregnant.  Ask for birth control (contraception) if you want to prevent pregnancy. Osteoporosis and menopause Osteoporosis is a disease in which the bones lose minerals and strength with aging. This can result in bone fractures. If you are 65 years old or older, or if you are at risk for osteoporosis and fractures, ask your health care provider if you should:  Be screened for bone loss.  Take a calcium or vitamin D  supplement to lower your risk of fractures.  Be given hormone replacement therapy (HRT) to treat symptoms of menopause. Follow these instructions at home: Lifestyle  Do not use any products that contain nicotine or tobacco, such as cigarettes, e-cigarettes, and chewing tobacco. If you need help quitting, ask your health care provider.  Do not use street drugs.  Do not share needles.  Ask your health care provider for help if you need support or information about quitting drugs. Alcohol use  Do not drink alcohol if: ? Your health care provider tells you not to drink. ? You are pregnant, may be pregnant, or are planning to become pregnant.  If you drink alcohol: ? Limit how much you use to 0-1 drink a day. ? Limit intake if you are breastfeeding.  Be aware of how much alcohol is in your drink. In the [place] one drink equals one 12 oz bottle of beer (355 mL), one 5 oz glass of wine (148 mL), or one 1 oz glass of hard liquor (44 mL). General instructions  Schedule regular health, dental, and eye exams.  Stay current with your vaccines.  Tell your health care provider if: ? You often feel depressed. ? You have ever been abused or do not feel safe at home. Summary  Adopting a healthy lifestyle and getting preventive care are important in promoting health and wellness.  Follow your health care provider's instructions about healthy diet, exercising, and getting tested or screened for diseases.  Follow your health care provider's instructions on monitoring your cholesterol and blood pressure. This information is not intended to replace advice given to you by your health care provider. Make sure you discuss any questions you have with your health care provider. Document Revised: 11/16/2018 Document Reviewed: 11/16/2018 Elsevier Patient Education  2021 [employer].

## 2021-01-29 NOTE — Progress Notes (Signed)
This visit occurred during the SARS-CoV-2 public health emergency.  Safety protocols were in place, including screening questions prior to the visit, additional usage of staff PPE, and extensive cleaning of exam room while observing appropriate contact time as indicated for disinfecting solutions.    Patient ID: Allison Collins, female  DOB: [birth year], 50 y.o.   MRN: [id number] Patient Care Team    Relationship Specialty Notifications Start End  Ma Hillock, DO PCP - General Family Medicine  09/12/19   Thornton Park, MD Consulting Physician Gastroenterology  01/29/21     Chief Complaint  Patient presents with  . Annual Exam    Subjective: Allison Collins is a 50 y.o.  Female  present for CPE. All past medical history, surgical history, allergies, family history, immunizations, medications and social history were updated in the electronic medical record today. All recent labs, ED visits and hospitalizations within the last year were reviewed.  Health maintenance:  Colonoscopy: MGF had colon cancer in his 66. Colonoscopy 12/2019- 3 yr follow up recommended. Due 2024.  Mammogram: completed: 01/04/2020 [facility], . Pt has silicone implants.  Cervical cancer screening: last pap: she is uncertain her last.  Immunizations: tdap updated today, Influenza UTD 2020(encouraged yearly), shingrix #1 today.>> N0.2 in 3 mos by nurse visit.  Infectious disease screening: HIV declined DEXA: routine screen Assistive device: none Oxygen PJK:DTOI Patient has a Dental home. Hospitalizations/ED visits: reviewed  depression:  Pt reports she has been prescribed prozac 20 mg qd since 2009 for depression. She reports she is doing wonderful on medication and would like refills.   Depression screen [facility] 2/9 01/29/2021 10/18/2020 11/08/2019 09/12/2019  Decreased Interest 0 0 0 0  Down, Depressed, Hopeless 0 0 0 0  PHQ - 2 Score 0 0 0 0  Altered sleeping 0 0 0 0  Tired, decreased  energy 0 0 0 1  Change in appetite 0 0 0 0  Feeling bad or failure about yourself  0 0 0 2  Trouble concentrating 0 0 0 3  Moving slowly or fidgety/restless 0 0 0 0  Suicidal thoughts 0 0 0 0  PHQ-9 Score 0 0 0 6  Difficult doing work/chores - - Not difficult at all Extremely dIfficult   GAD 7 : Generalized Anxiety Score 10/18/2020 09/12/2019  Nervous, Anxious, on Edge 0 0  Control/stop worrying 0 0  Worry too much - different things 0 0  Trouble relaxing 0 3  Restless 0 -  Easily annoyed or irritable 0 3  Afraid - awful might happen 0 0  Total GAD 7 Score 0 -  Anxiety Difficulty - Extremely difficult    Immunization History  Administered Date(s) Administered  . Influenza,inj,Quad PF,6+ Mos 09/12/2019, 10/18/2020  . Janssen (J&J) SARS-COV-2 Vaccination 08/21/2020  . Tdap 11/08/2019  . Zoster Recombinat (Shingrix) 11/08/2019, 01/29/2021   Past Medical History:  Diagnosis Date  . Allergy   . Chicken pox   . Depression    No Known Allergies Past Surgical History:  Procedure Laterality Date  . ABDOMINOPLASTY    . AUGMENTATION MAMMAPLASTY Bilateral   . BREAST ENHANCEMENT SURGERY  2016   saline > 10 yrs with revision 7124 to silicone.   Marland Kitchen CESAREAN SECTION  2014   Family History  Problem Relation Age of Onset  . Drug abuse Mother   . Early death Mother 20  . Stroke Mother   . Pulmonary fibrosis Mother        passed away from  condition  . Hearing loss Father   . Alzheimer's disease Maternal Grandmother   . Breast cancer Maternal Grandmother   . Colon cancer Maternal Grandfather   . Colon polyps Neg Hx   . Esophageal cancer Neg Hx   . Rectal cancer Neg Hx   . Stomach cancer Neg Hx    Social History   Social History Narrative   Marital status/children/pets: Married, 1 child   Education/employment: college, homemaker   Safety:      -smoke alarm in the home:Yes     - wears seatbelt: Yes     - Feels safe in their relationships: Yes    Allergies as of 01/29/2021    No Known Allergies     Medication List       Accurate as of January 29, 2021  4:26 PM. If you have any questions, ask your nurse or doctor.        BIOTIN PO Take by mouth daily.   FLUoxetine 20 MG capsule Commonly known as: PROZAC Take 1 capsule (20 mg total) by mouth daily.   multivitamin tablet Take 1 tablet by mouth daily.   nicotine polacrilex 4 MG gum Commonly known as: NICORETTE Take 4 mg by mouth as needed for smoking cessation.       All past medical history, surgical history, allergies, family history, immunizations andmedications were updated in the EMR today and reviewed under the history and medication portions of their EMR.      MM 3D SCREEN BREAST W/IMPLANT BILATERAL  Result Date: 01/18/2020 CLINICAL DATA:  Screening. EXAM: DIGITAL SCREENING BILATERAL MAMMOGRAM WITH IMPLANTS, CAD AND TOMO The patient has bilateral retroglandular implants. Standard and implant displaced views were performed. COMPARISON:  Previous exam(s). ACR Breast Density Category c: The breast tissue is heterogeneously dense, which may obscure small masses. FINDINGS: There are no findings suspicious for malignancy. Images were processed with CAD. IMPRESSION: No mammographic evidence of malignancy. A result letter of this screening mammogram will be mailed directly to the patient. RECOMMENDATION: Screening mammogram in one year. (Code:SM-B-01Y) BI-RADS CATEGORY  1:  Negative. Electronically Signed   By: Everlean Alstrom M.D.   On: 01/18/2020 07:27     ROS: 14 pt review of systems performed and negative (unless mentioned in an HPI)  Objective: BP (!) 96/54   Pulse (!) 54   Temp 98 F (36.7 C) (Oral)   Resp 16   Ht 5' 4.57" (1.64 m)   Wt 141 lb 9.6 oz (64.2 kg)   SpO2 97%   BMI 23.88 kg/m  Gen: Afebrile. No acute distress. Nontoxic in appearance, well-developed, well-nourished,    HENT: AT. [place]. Bilateral TM visualized and normal in appearance, normal external auditory canal. MMM, no  oral lesions, adequate dentition. Bilateral nares within normal limits. Throat without erythema, ulcerations or exudates.  No cough on exam, no hoarseness on exam. Eyes:Pupils Equal Round Reactive to light, Extraocular movements intact,  Conjunctiva without redness, discharge or icterus. Neck/lymp/endocrine: Supple, no lymphadenopathy, no thyromegaly CV: RRR no murmur, no edema, +2/4 P posterior tibialis pulses.  Chest: CTAB, no wheeze, rhonchi or crackles.  Normal respiratory effort.  Good air movement. Abd: Soft.  Flat. NTND. BS present.  No masses palpated. No hepatosplenomegaly. No rebound tenderness or guarding. Skin: No rashes, purpura or petechiae. Warm and well-perfused. Skin intact.  Right posterior axillary/lateral thoracic mass. Neuro/Msk:  Normal gait. PERLA. EOMi. Alert. Oriented x3.  Cranial nerves II through XII intact. Muscle strength 5/5 upper/lower extremity. DTRs equal bilaterally.  Psych: Normal affect, dress and demeanor. Normal speech. Normal thought content and judgment.   No exam data present  Assessment/plan: Allison Collins is a 50 y.o. female present for CPE/cmc Depression, major, single episode, mild (HCC) stable continue prozac 20 mg QD.  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    Encounter for screening mammogram for malignant neoplasm of breast - MM DIGITAL SCREENING W/ IMPLANTS BILATERAL; Future  Family history of vascular disease/Encounter for screening for vascular disease - CT CARDIAC SCORING (SELF PAY ONLY); Future Encounter for preventive health examination Patient was encouraged  to exercise greater than 150 minutes a week. Patient was encouraged to choose a diet filled with fresh fruits and vegetables, and lean meats. AVS provided to patient today for education/recommendation on gender specific health and safety maintenance. Colonoscopy: MGF had colon cancer in his 62. Colonoscopy 12/2019- 3 yr follow up recommended. Due 2024.  Mammogram: completed: 01/04/2020 [facility], . Pt has silicone implants.  Cervical cancer screening: last pap: she is uncertain her last.  Immunizations: tdap updated today, Influenza UTD 2020(encouraged yearly), shingrix #1 today.>> N0.2 in 3 mos by nurse visit.  Infectious disease screening: HIV declined DEXA: routine screen  Return in about 5 months (around 07/15/2021) for CMC (30 min) and 1 yr cpe.  Orders Placed This Encounter  Procedures  . MM DIGITAL SCREENING W/ IMPLANTS BILATERAL  . CT CARDIAC SCORING (SELF PAY ONLY)  . Varicella-zoster vaccine IM  . CBC with Differential/Platelet  . Comprehensive metabolic panel  . Hemoglobin A1c  . Lipid panel  . TSH  . Hepatitis C Antibody  . Ambulatory referral to Plastic Surgery    Orders Placed This Encounter  Procedures  . MM DIGITAL SCREENING W/ IMPLANTS BILATERAL  . CT CARDIAC SCORING (SELF PAY ONLY)  . Varicella-zoster vaccine IM  . CBC with Differential/Platelet  . Comprehensive metabolic panel  . Hemoglobin A1c  . Lipid panel  . TSH  . Hepatitis C Antibody  . Ambulatory referral to Plastic Surgery   Meds ordered this encounter  Medications  . FLUoxetine (PROZAC) 20 MG capsule    Sig: Take 1 capsule (20 mg total) by mouth daily.    Dispense:  90 capsule    Refill:  1    Referral Orders     Ambulatory referral to Plastic Surgery   Electronically signed by: Howard Pouch, DO [place]

## 2021-01-30 LAB — CBC WITH DIFFERENTIAL/PLATELET
Absolute Monocytes: 320 cells/uL (ref 200–950)
Basophils Absolute: 50 cells/uL (ref 0–200)
Basophils Relative: 1.1 %
Eosinophils Absolute: 131 cells/uL (ref 15–500)
Eosinophils Relative: 2.9 %
HCT: 34.6 % — ABNORMAL LOW (ref 35.0–45.0)
Hemoglobin: 11.8 g/dL (ref 11.7–15.5)
Lymphs Abs: 1512 cells/uL (ref 850–3900)
MCH: 29.4 pg (ref 27.0–33.0)
MCHC: 34.1 g/dL (ref 32.0–36.0)
MCV: 86.3 fL (ref 80.0–100.0)
MPV: 10.6 fL (ref 7.5–12.5)
Monocytes Relative: 7.1 %
Neutro Abs: 2489 cells/uL (ref 1500–7800)
Neutrophils Relative %: 55.3 %
Platelets: 283 10*3/uL (ref 140–400)
RBC: 4.01 10*6/uL (ref 3.80–5.10)
RDW: 12.9 % (ref 11.0–15.0)
Total Lymphocyte: 33.6 %
WBC: 4.5 10*3/uL (ref 3.8–10.8)

## 2021-01-30 LAB — COMPREHENSIVE METABOLIC PANEL
AG Ratio: 1.4 (calc) (ref 1.0–2.5)
ALT: 20 U/L (ref 6–29)
AST: 26 U/L (ref 10–35)
Albumin: 4 g/dL (ref 3.6–5.1)
Alkaline phosphatase (APISO): 55 U/L (ref 37–153)
BUN: 11 mg/dL (ref 7–25)
CO2: 25 mmol/L (ref 20–32)
Calcium: 9.4 mg/dL (ref 8.6–10.4)
Chloride: 104 mmol/L (ref 98–110)
Creat: 0.87 mg/dL (ref 0.50–1.05)
Globulin: 2.8 g/dL (calc) (ref 1.9–3.7)
Glucose, Bld: 82 mg/dL (ref 65–99)
Potassium: 4.3 mmol/L (ref 3.5–5.3)
Sodium: 140 mmol/L (ref 135–146)
Total Bilirubin: 0.7 mg/dL (ref 0.2–1.2)
Total Protein: 6.8 g/dL (ref 6.1–8.1)

## 2021-01-30 LAB — HEMOGLOBIN A1C
Hgb A1c MFr Bld: 5.4 % of total Hgb (ref <5.7)
Mean Plasma Glucose: 108 mg/dL
eAG (mmol/L): 6 mmol/L

## 2021-01-30 LAB — LIPID PANEL
Cholesterol: 194 mg/dL (ref <200)
HDL: 77 mg/dL (ref >=50)
LDL Cholesterol (Calc): 100 mg/dL (calc) — ABNORMAL HIGH
Non-HDL Cholesterol (Calc): 117 mg/dL (calc) (ref <130)
Total CHOL/HDL Ratio: 2.5 (calc) (ref <5.0)
Triglycerides: 77 mg/dL (ref <150)

## 2021-01-30 LAB — HEPATITIS C ANTIBODY
Hepatitis C Ab: NONREACTIVE
SIGNAL TO CUT-OFF: 0.01 (ref <1.00)

## 2021-01-30 LAB — TSH: TSH: 1.22 mIU/L

## 2021-02-10 ENCOUNTER — Encounter: Payer: Self-pay | Admitting: Plastic Surgery

## 2021-02-10 ENCOUNTER — Other Ambulatory Visit: Payer: Self-pay

## 2021-02-10 ENCOUNTER — Ambulatory Visit: Payer: PRIVATE HEALTH INSURANCE | Admitting: Plastic Surgery

## 2021-02-10 VITALS — BP 100/67 | HR 65 | Ht 64.0 in | Wt 144.6 lb

## 2021-02-10 DIAGNOSIS — D1721 Benign lipomatous neoplasm of skin and subcutaneous tissue of right arm: Secondary | ICD-10-CM | POA: Diagnosis not present

## 2021-02-10 DIAGNOSIS — Z719 Counseling, unspecified: Secondary | ICD-10-CM

## 2021-02-10 NOTE — Progress Notes (Signed)
Referring Provider Howard Pouch A, DO [address] [place],  [place] [address]   CC:  Chief Complaint  Patient presents with  . Advice Only      Allison Collins is an 50 y.o. female.  HPI: Patient presents to discuss a painful mass behind her right shoulder.  Is been present for a number of years.  It seems to be growing in size.  She is bothered by it and and then has some implications when she is trying to move her right arm through range of motion.  She is interested in having it removed.  No Known Allergies  Outpatient Encounter Medications as of 02/10/2021  Medication Sig  . BIOTIN PO Take by mouth daily.  Marland Kitchen FLUoxetine (PROZAC) 20 MG capsule Take 1 capsule (20 mg total) by mouth daily.  . Multiple Vitamin (MULTIVITAMIN) tablet Take 1 tablet by mouth daily.  . nicotine polacrilex (NICORETTE) 4 MG gum Take 4 mg by mouth as needed for smoking cessation.   No facility-administered encounter medications on file as of 02/10/2021.     Past Medical History:  Diagnosis Date  . Allergy   . Chicken pox   . Depression     Past Surgical History:  Procedure Laterality Date  . ABDOMINOPLASTY    . AUGMENTATION MAMMAPLASTY Bilateral   . BREAST ENHANCEMENT SURGERY  2016   saline > 10 yrs with revision 6568 to silicone.   Marland Kitchen CESAREAN SECTION  2014    Family History  Problem Relation Age of Onset  . Drug abuse Mother   . Early death Mother 18  . Stroke Mother   . Pulmonary fibrosis Mother        passed away from condition  . Hearing loss Father   . Alzheimer's disease Maternal Grandmother   . Breast cancer Maternal Grandmother   . Colon cancer Maternal Grandfather   . Colon polyps Neg Hx   . Esophageal cancer Neg Hx   . Rectal cancer Neg Hx   . Stomach cancer Neg Hx     Social History   Social History Narrative   Marital status/children/pets: Married, 1 child   Education/employment: college, homemaker   Safety:      -smoke alarm in the home:Yes     - wears  seatbelt: Yes     - Feels safe in their relationships: Yes     Review of Systems General: Denies fevers, chills, weight loss CV: Denies chest pain, shortness of breath, palpitations  Physical Exam Vitals with BMI 02/10/2021 01/29/2021 12/18/2020  Height 5\' 4"  5' 4.567" 5\' 4"   Weight 144 lbs 10 oz 141 lbs 10 oz 143 lbs  BMI 24.81 12.75 17.00  Systolic 174 96 944  Diastolic 67 54 78  Pulse 65 54 -    General:  No acute distress,  Alert and oriented, Non-Toxic, Normal speech and affect Examination shows a 3 to 4 cm mass on the posterior aspect of the right shoulder.  It is right where the posterior axillary fold comes onto the back.  There is what looks like a small scar in that area but she says that this mass has not been removed before.  Assessment/Plan Patient presents with a symptomatic mass, posterior aspect of the right shoulder.  I suspect this is a lipoma.  We discussed removal because of her symptoms.  We discussed the risks include bleeding, infection, damage to surrounding structures and need for additional procedures.  She is interested in doing this  under local.  All of her questions were answered and we will plan to proceed.  Allison Collins 02/10/2021, 2:01 PM

## 2021-02-25 ENCOUNTER — Encounter (HOSPITAL_BASED_OUTPATIENT_CLINIC_OR_DEPARTMENT_OTHER): Payer: Self-pay

## 2021-02-25 ENCOUNTER — Ambulatory Visit (HOSPITAL_BASED_OUTPATIENT_CLINIC_OR_DEPARTMENT_OTHER)
Admission: RE | Admit: 2021-02-25 | Discharge: 2021-02-25 | Disposition: A | Discharge status: Home / Self Care | Payer: BC Managed Care – PPO | Source: Ambulatory Visit | Attending: Family Medicine | Admitting: Family Medicine

## 2021-02-25 ENCOUNTER — Other Ambulatory Visit (HOSPITAL_BASED_OUTPATIENT_CLINIC_OR_DEPARTMENT_OTHER): Payer: Self-pay | Admitting: Family Medicine

## 2021-02-25 ENCOUNTER — Other Ambulatory Visit: Payer: Self-pay

## 2021-02-25 ENCOUNTER — Other Ambulatory Visit: Payer: PRIVATE HEALTH INSURANCE

## 2021-02-25 DIAGNOSIS — Z136 Encounter for screening for cardiovascular disorders: Secondary | ICD-10-CM | POA: Insufficient documentation

## 2021-02-25 DIAGNOSIS — Z9882 Breast implant status: Secondary | ICD-10-CM | POA: Insufficient documentation

## 2021-02-25 DIAGNOSIS — Z8249 Family history of ischemic heart disease and other diseases of the circulatory system: Secondary | ICD-10-CM

## 2021-02-25 DIAGNOSIS — Z1231 Encounter for screening mammogram for malignant neoplasm of breast: Secondary | ICD-10-CM | POA: Diagnosis not present

## 2021-03-19 ENCOUNTER — Other Ambulatory Visit (HOSPITAL_COMMUNITY)
Admission: RE | Admit: 2021-03-19 | Discharge: 2021-03-19 | Disposition: A | Discharge status: Home / Self Care | Payer: BC Managed Care – PPO | Source: Ambulatory Visit | Attending: Plastic Surgery | Admitting: Plastic Surgery

## 2021-03-19 ENCOUNTER — Encounter: Payer: Self-pay | Admitting: Plastic Surgery

## 2021-03-19 ENCOUNTER — Encounter: Payer: Self-pay | Admitting: Family Medicine

## 2021-03-19 ENCOUNTER — Other Ambulatory Visit: Payer: Self-pay

## 2021-03-19 ENCOUNTER — Ambulatory Visit: Payer: PRIVATE HEALTH INSURANCE | Admitting: Family Medicine

## 2021-03-19 ENCOUNTER — Ambulatory Visit: Payer: PRIVATE HEALTH INSURANCE | Admitting: Plastic Surgery

## 2021-03-19 VITALS — BP 93/59 | HR 79 | Temp 98.4°F | Ht 64.0 in | Wt 143.0 lb

## 2021-03-19 VITALS — BP 111/75 | HR 74

## 2021-03-19 DIAGNOSIS — R42 Dizziness and giddiness: Secondary | ICD-10-CM | POA: Diagnosis not present

## 2021-03-19 DIAGNOSIS — D1721 Benign lipomatous neoplasm of skin and subcutaneous tissue of right arm: Secondary | ICD-10-CM | POA: Insufficient documentation

## 2021-03-19 DIAGNOSIS — R413 Other amnesia: Secondary | ICD-10-CM

## 2021-03-19 MED ORDER — HYDROCODONE-ACETAMINOPHEN 5-325 MG PO TABS: 1.0000 | ORAL_TABLET | Freq: Four times a day (QID) | ORAL | 0 refills | Status: DC | PRN

## 2021-03-19 NOTE — Patient Instructions (Addendum)
Alzheimer's Disease Alzheimer's disease is a brain disease that affects memory, thinking, language, and behavior. People with Alzheimer's disease lose mental abilities, and the disease gets worse over time. Alzheimer's disease is a form of dementia. What are the causes? This condition develops when a protein called beta-amyloid forms deposits in the brain. It is not known what causes these deposits to form. Alzheimer's disease may also be caused by a gene mutation that is inherited from one parent or both parents. A gene mutation is a harmful change in a gene. Not everyone who inherits the genetic mutation will get the disease. What increases the risk? You are more likely to develop this condition if you:  Are older than age 65.  Are female.  Have any of these medical conditions: ? High blood pressure. ? Diabetes. ? Heart or blood vessel disease.  Smoke.  Have obesity.  Have had a brain injury.  Have had a stroke.  Have a family history of dementia. What are the signs or symptoms? Symptoms of this condition may happen in three stages, which often overlap. Early stage In this stage, you may continue to be independent. You may still be able to drive, work, and be social. Symptoms in this stage include:  Minor memory problems, such as forgetting a name, words, or what you did recently.  Difficulty with: ? Paying attention. ? Communicating. ? Doing familiar tasks. ? Problem solving or doing calculations. ? Following instructions. ? Learning new things.  Anxiety.  Social withdrawal.  Loss of motivation. Moderate stage In this stage, you will start to need care. Symptoms in this stage include:  Difficulty with expressing thoughts.  Memory loss that affects daily life. This can include forgetting: ? Recent events that have happened. ? If you have taken medicines or eaten. ? Familiar places. You may get lost while walking or driving. ? To pay bills or manage  finances. ? Personal hygiene such as bathing or using the bathroom.  Confusion about where you are or what time it is.  Difficulty in judging distance.  Changes in personality, mood, and behavior. You may be moody, irritable, angry, frustrated, fearful, anxious, or suspicious.  Poor reasoning and judgment.  Delusions or hallucinations.  Changes in sleep patterns. Severe stage In the final stage, you will need help with your personal care and daily activities. Symptoms in this stage include:  Worsening memory loss.  Personality changes.  Loss of awareness of your surroundings.  Changes in physical abilities, including the ability to walk, sit, and swallow.  Difficulty in communicating.  Inability to control your bladder and bowels.  Increasing confusion.  Increasing behavior changes. How is this diagnosed? This condition is diagnosed by a health care provider who specializes in diseases of the nervous system (neurologist) or one who specializes in care of the elderly (geriatrician or geriatric psychiatrist). Other causes of dementia may also be ruled out. Your health care provider will talk with you and your family, friends, or caregivers about your history and symptoms. A thorough medical history will be taken, and you will have a physical exam and tests. Tests may include:  Lab tests, such as blood or urine tests.  Imaging tests, such as a CT scan, a PET scan, or an MRI.  A lumbar puncture. This test involves removing and testing a small amount of the fluid that surrounds the brain and spinal cord.  An electroencephalogram (EEG). In this test, small metal discs are used to measure electrical activity in the brain.    Memory tests, cognitive tests, and neuropsychological tests. These tests evaluate brain function.  Genetic testing. This may be done if you have early onset of the disease (before age 60) or if other family members have the disease.   How is this  treated? At this time, there is no treatment to cure Alzheimer's disease or stop it from getting worse. The goals of treatment are:  To manage behavioral changes.  To provide you with a safe environment.  To help manage daily life for you and your caregivers. The following treatment options are available:  Medicines. Medicines may help the memory work better and manage behavioral symptoms.  Cognitive therapy. Cognitive therapy provides you with education, support, and memory aids. It is most helpful in the early stages of the condition.  Counseling or spiritual guidance. It is normal to have a lot of feelings, including anger, relief, fear, and isolation. Counseling and guidance can help you deal with these feelings.  Caregiving. This involves having caregivers help you with your daily activities.  Family support groups. These provide education, emotional support, and information about community resources to family members who are taking care of you. Follow these instructions at home: Medicines  Take over-the-counter and prescription medicines only as told by your health care provider.  Use a pill organizer or pill reminder to help you manage your medicines.  Avoid taking medicines that can affect thinking, such as pain medicines or sleeping medicines. Lifestyle  Make healthy lifestyle choices: ? Be physically active as told by your health care provider. Regular exercise may help improve symptoms. ? Do not use any products that contain nicotine or tobacco, such as cigarettes, e-cigarettes, and chewing tobacco. If you need help quitting, ask your health care provider. ? Do not drink alcohol. ? Eat a healthy diet. ? Practice stress-management techniques when you get stressed. ? Stay social.  Drink enough fluid to keep your urine pale yellow.  Make sure to get quality sleep. ? Avoid taking long naps during the day. Take short naps of 30 minutes or less if needed. ? Keep your  sleeping area dark and cool. ? Avoid exercising during the few hours before you go to bed. ? Avoid caffeine products in the afternoon and evening. General instructions  Work with your health care provider to determine what you need help with and what your safety needs are.  If you were given a bracelet that identifies you as a person with memory loss or tracks your location, make sure to wear it at all times.  Talk with your health care provider about whether it is safe for you to drive.  Work with your family to make important decisions, such as advance directives, medical power of attorney, or a living will.  Keep all follow-up visits. This is important.   Where to find more information  The Alzheimer's Association: Call the 24-hour helpline at 1-800-272-3900, or visit www.alz.org Contact a health care provider if:  You have nausea, vomiting, or trouble with eating related to a medicine.  You have worsening mood or behavior changes, such as depression, anxiety, or hallucinations.  You or your family members become concerned for your safety. Get help right away if:  You become less responsive or are difficult to wake up.  Your memory suddenly gets worse.  You feel that you want to harm yourself. If you ever feel like you may hurt yourself or others, or have thoughts about taking your own life, get help right away. Go to   your nearest emergency department or:  Call your local emergency services (911 in the [place]  Call a suicide crisis helpline, such as the National Suicide Prevention Lifeline at 1-800-273-8255. This is open 24 hours a day in the [place]  Text the Crisis Text Line at 741741 (in the [place] Summary  Alzheimer's disease is a brain disease that affects memory, thinking, language, and behavior. Alzheimer's disease is a form of dementia.  This condition is diagnosed by a specialist in diseases of the nervous system (neurologist) or one who specializes in care of the  elderly.  At this time, there is no treatment to cure Alzheimer's disease or stop it from getting worse. The goal of treatment is to help you manage any symptoms.  Work with your family to make important decisions, such as advance directives, medical power of attorney, or a living will. This information is not intended to replace advice given to you by your health care provider. Make sure you discuss any questions you have with your health care provider. Document Revised: 03/11/2020 Document Reviewed: 03/11/2020 Elsevier Patient Education  2021 Elsevier Inc.  

## 2021-03-19 NOTE — Addendum Note (Signed)
Addended by: Cindra Presume on: 03/19/2021 01:33 PM   Modules accepted: Orders

## 2021-03-19 NOTE — Progress Notes (Signed)
Operative Note   DATE OF OPERATION: 03/19/2021  LOCATION:    SURGICAL DEPARTMENT: Plastic Surgery  PREOPERATIVE DIAGNOSES:  Right shoulder lipoma  POSTOPERATIVE DIAGNOSES:  same  PROCEDURE:  1. Excision of right shoulder lipoma measuring 6 cm 2. Complex closure measuring 6 cm  SURGEON: Talmadge Coventry, MD  ANESTHESIA:  Local  COMPLICATIONS: None.   INDICATIONS FOR PROCEDURE:  The patient, Allison Collins is a 50 y.o. female born on [birth year], is here for treatment of right shoulder lipoma MRN: [id number]  CONSENT:  Informed consent was obtained directly from the patient. Risks, benefits and alternatives were fully discussed. Specific risks including but not limited to bleeding, infection, hematoma, seroma, scarring, pain, infection, wound healing problems, and need for further surgery were all discussed. The patient did have an ample opportunity to have questions answered to satisfaction.   DESCRIPTION OF PROCEDURE:  Local anesthesia was administered. The patient's operative site was prepped and draped in a sterile fashion. A time out was performed and all information was confirmed to be correct.  The lesion was excised with a 15 blade.  Hemostasis was obtained.  Circumferential undermining was performed and the skin was advanced and closed in layers with interrupted buried Monocryl sutures and 3-0 monocryl subcuticular for the skin.  The lesion excised measured 6cm, and the total length of closure measured 6cm.    The patient tolerated the procedure well.  There were no complications.
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
## 2021-03-21 LAB — TSH: TSH: 1.97 mIU/L

## 2021-03-21 LAB — SURGICAL PATHOLOGY

## 2021-03-21 LAB — B12 AND FOLATE PANEL
Folate: >24 ng/mL
Vitamin B-12: 573 pg/mL (ref 200–1100)

## 2021-03-21 LAB — RPR: RPR Ser Ql: NONREACTIVE

## 2021-03-21 LAB — VITAMIN D 25 HYDROXY (VIT D DEFICIENCY, FRACTURES): Vit D, 25-Hydroxy: 22 ng/mL — ABNORMAL LOW (ref 30–100)

## 2021-03-21 LAB — VITAMIN B1

## 2021-03-31 ENCOUNTER — Telehealth: Payer: Self-pay | Admitting: Family Medicine

## 2021-03-31 NOTE — Telephone Encounter (Signed)
Please call pt Allison Collins B1 test was canceled by lab due to error. I do not feel the need to reorder at this time.  She was instructed to take a B-complex or B12 supplement in prior phone note, as well as vit d ([please see not for dosage recs).   Allison Collins options now are as followed: - wait 8-12 weeks - allowing supplements to increase vitamin levels, which may help with memory/fatigue. - or-  I can refer Allison Collins for neuro eval on Allison Collins memory.   Please advise of Allison Collins decision.  - f/u 3 months on memory/fatigue and vitamin def.    Thanks.

## 2021-03-31 NOTE — Telephone Encounter (Signed)
Spoke with patient regarding recommendations, she would prefer to wait 8-12 weeks and hold off on neuro referal. 3 month provider appt scheduled, made sure pt was aware of dosage recs.

## 2021-04-03 ENCOUNTER — Encounter: Payer: PRIVATE HEALTH INSURANCE | Admitting: Family Medicine

## 2021-06-23 ENCOUNTER — Ambulatory Visit: Payer: PRIVATE HEALTH INSURANCE | Admitting: Family Medicine

## 2021-07-15 ENCOUNTER — Ambulatory Visit: Payer: PRIVATE HEALTH INSURANCE | Admitting: Family Medicine

## 2021-08-30 ENCOUNTER — Other Ambulatory Visit: Payer: Self-pay | Admitting: Family Medicine

## 2021-10-06 ENCOUNTER — Other Ambulatory Visit: Payer: Self-pay | Admitting: Family Medicine

## 2022-01-09 ENCOUNTER — Telehealth: Payer: Self-pay | Admitting: Family Medicine

## 2022-01-09 NOTE — Telephone Encounter (Signed)
Pt called about med refill  FLUoxetine FLUoxetine (PROZAC) 20 MG capsule  Informed pt that I can send a note back, since we haven't seen her since August of last year. She would need to be scheduled for an appointment. Pt said refused said medication is not a control substance.   Pt wants to know can she get a supply that can last till her upcoming visit on 01/30/2022.-KR

## 2022-01-12 MED ORDER — FLUOXETINE HCL 20 MG PO CAPS: ORAL_CAPSULE | ORAL | 0 refills | Status: DC

## 2022-01-12 NOTE — Telephone Encounter (Signed)
Rx sent 

## 2022-01-29 ENCOUNTER — Other Ambulatory Visit: Payer: Self-pay

## 2022-01-30 ENCOUNTER — Encounter: Payer: Self-pay | Admitting: Family Medicine

## 2022-01-30 ENCOUNTER — Ambulatory Visit (INDEPENDENT_AMBULATORY_CARE_PROVIDER_SITE_OTHER): Payer: PRIVATE HEALTH INSURANCE | Admitting: Family Medicine

## 2022-01-30 VITALS — BP 94/58 | HR 76 | Temp 97.4°F | Ht 64.0 in | Wt 131.0 lb

## 2022-01-30 DIAGNOSIS — Z0000 Encounter for general adult medical examination without abnormal findings: Secondary | ICD-10-CM

## 2022-01-30 DIAGNOSIS — Z130 Encounter for screening for diseases of the blood and blood-forming organs and certain disorders involving the immune mechanism: Secondary | ICD-10-CM

## 2022-01-30 DIAGNOSIS — F320 Major depressive disorder, single episode, mild: Secondary | ICD-10-CM | POA: Diagnosis not present

## 2022-01-30 DIAGNOSIS — Z13220 Encounter for screening for lipoid disorders: Secondary | ICD-10-CM | POA: Diagnosis not present

## 2022-01-30 DIAGNOSIS — Z79899 Other long term (current) drug therapy: Secondary | ICD-10-CM

## 2022-01-30 DIAGNOSIS — Z1231 Encounter for screening mammogram for malignant neoplasm of breast: Secondary | ICD-10-CM

## 2022-01-30 DIAGNOSIS — Z131 Encounter for screening for diabetes mellitus: Secondary | ICD-10-CM

## 2022-01-30 LAB — CBC
HCT: 36.8 % (ref 36.0–46.0)
Hemoglobin: 12.3 g/dL (ref 12.0–15.0)
MCHC: 33.4 g/dL (ref 30.0–36.0)
MCV: 87.7 fl (ref 78.0–100.0)
Platelets: 328 10*3/uL (ref 150.0–400.0)
RBC: 4.2 Mil/uL (ref 3.87–5.11)
RDW: 13.6 % (ref 11.5–15.5)
WBC: 3.7 10*3/uL — ABNORMAL LOW (ref 4.0–10.5)

## 2022-01-30 LAB — COMPREHENSIVE METABOLIC PANEL
ALT: 21 U/L (ref 0–35)
AST: 24 U/L (ref 0–37)
Albumin: 4.3 g/dL (ref 3.5–5.2)
Alkaline Phosphatase: 67 U/L (ref 39–117)
BUN: 14 mg/dL (ref 6–23)
CO2: 32 mEq/L (ref 19–32)
Calcium: 9.5 mg/dL (ref 8.4–10.5)
Chloride: 103 mEq/L (ref 96–112)
Creatinine, Ser: 0.97 mg/dL (ref 0.40–1.20)
GFR: 67.24 mL/min (ref >60.00)
Glucose, Bld: 79 mg/dL (ref 70–99)
Potassium: 5 mEq/L (ref 3.5–5.1)
Sodium: 137 mEq/L (ref 135–145)
Total Bilirubin: 0.6 mg/dL (ref 0.2–1.2)
Total Protein: 7.1 g/dL (ref 6.0–8.3)

## 2022-01-30 LAB — TSH: TSH: 3.08 u[IU]/mL (ref 0.35–5.50)

## 2022-01-30 LAB — LIPID PANEL
Cholesterol: 224 mg/dL — ABNORMAL HIGH (ref 0–200)
HDL: 79.5 mg/dL (ref >39.00)
LDL Cholesterol: 130 mg/dL — ABNORMAL HIGH (ref 0–99)
NonHDL: 144.31
Total CHOL/HDL Ratio: 3
Triglycerides: 70 mg/dL (ref 0.0–149.0)
VLDL: 14 mg/dL (ref 0.0–40.0)

## 2022-01-30 LAB — HEMOGLOBIN A1C: Hgb A1c MFr Bld: 5.8 % (ref 4.6–6.5)

## 2022-01-30 MED ORDER — FLUOXETINE HCL 20 MG PO CAPS: ORAL_CAPSULE | ORAL | 2 refills | Status: DC

## 2022-01-30 NOTE — Patient Instructions (Addendum)

## 2022-01-30 NOTE — Progress Notes (Signed)
This visit occurred during the SARS-CoV-2 public health emergency.  Safety protocols were in place, including screening questions prior to the visit, additional usage of staff PPE, and extensive cleaning of exam room while observing appropriate contact time as indicated for disinfecting solutions.    Patient ID: Allison Collins, female  DOB: 1971/01/29, 51 y.o.   MRN: [id number] Patient Care Team    Relationship Specialty Notifications Start End  Ma Hillock, DO PCP - General Family Medicine  09/12/19   Thornton Park, MD Consulting Physician Gastroenterology  01/29/21   Joseph Pierini, MD (Inactive) Consulting Physician Obstetrics and Gynecology  01/29/21   Eunice Blase, MD  Family Medicine  01/29/21   Dohmeier, Asencion Partridge, MD Consulting Physician Neurology  01/29/21     Chief Complaint  Patient presents with   Annual Exam    Pt is fasting    Subjective: Allison Collins is a 51 y.o.  Female  present for CPE/CMC. All past medical history, surgical history, allergies, family history, immunizations, medications and social history were updated in the electronic medical record today. All recent labs, ED visits and hospitalizations within the last year were reviewed.  Health maintenance:  Colonoscopy: MGF had colon cancer in his 84. Colonoscopy 12/2019- 3 yr follow up recommended. Due 2024. Dr. Tarri Glenn Mammogram: completed: 02/2021 [facility], . Pt has silicone implants.  Cervical cancer screening: last pap: she is uncertain her last.  Immunizations: tdap updated 2020, Influenza declined (encouraged yearly), shingrix  completed. Covid completed Infectious disease screening: HIV declined. Hep c completed DEXA: routine screen Assistive device: none Oxygen use: none Patient has a Dental home. Hospitalizations/ED visits: reviewed  depression:  Pt reports she has been prescribed prozac 20 mg qd since 2009 for depression. She reports she is compliant with medication  and it is working well for her.  Depression screen [facility] 2/9 03/19/2021 01/29/2021 10/18/2020 11/08/2019 09/12/2019  Decreased Interest 0 0 0 0 0  Down, Depressed, Hopeless 0 0 0 0 0  PHQ - 2 Score 0 0 0 0 0  Altered sleeping - 0 0 0 0  Tired, decreased energy - 0 0 0 1  Change in appetite - 0 0 0 0  Feeling bad or failure about yourself  - 0 0 0 2  Trouble concentrating - 0 0 0 3  Moving slowly or fidgety/restless - 0 0 0 0  Suicidal thoughts - 0 0 0 0  PHQ-9 Score - 0 0 0 6  Difficult doing work/chores - - - Not difficult at all Extremely dIfficult   GAD 7 : Generalized Anxiety Score 10/18/2020 09/12/2019  Nervous, Anxious, on Edge 0 0  Control/stop worrying 0 0  Worry too much - different things 0 0  Trouble relaxing 0 3  Restless 0 -  Easily annoyed or irritable 0 3  Afraid - awful might happen 0 0  Total GAD 7 Score 0 -  Anxiety Difficulty - Extremely difficult    Immunization History  Administered Date(s) Administered   Influenza,inj,Quad PF,6+ Mos 09/12/2019, 10/18/2020   Janssen (J&J) SARS-COV-2 Vaccination 08/21/2020   Tdap 11/08/2019   Zoster Recombinat (Shingrix) 11/08/2019, 01/29/2021    Past Medical History:  Diagnosis Date   Allergy    Chicken pox    Depression    No Known Allergies Past Surgical History:  Procedure Laterality Date   ABDOMINOPLASTY     AUGMENTATION MAMMAPLASTY Bilateral    BREAST ENHANCEMENT SURGERY  2016   saline > 10 yrs with  revision 1610 to silicone.    CESAREAN SECTION  2014   Family History  Problem Relation Age of Onset   Drug abuse Mother    Early death Mother 51   Stroke Mother    Pulmonary fibrosis Mother        passed away from condition   Hearing loss Father    Alzheimer's disease Maternal Grandmother    Breast cancer Maternal Grandmother    Colon cancer Maternal Grandfather    Colon polyps Neg Hx    Esophageal cancer Neg Hx    Rectal cancer Neg Hx    Stomach cancer Neg Hx    Social History   Social History  Narrative   Marital status/children/pets: Married, 1 child   Education/employment: college, homemaker   Safety:      -smoke alarm in the home:Yes     - wears seatbelt: Yes     - Feels safe in their relationships: Yes    Allergies as of 01/30/2022   No Known Allergies      Medication List        Accurate as of January 30, 2022 10:13 AM. If you have any questions, ask your nurse or doctor.          STOP taking these medications    HYDROcodone-acetaminophen 5-325 MG tablet Commonly known as: Norco Stopped by: Howard Pouch, DO       TAKE these medications    BIOTIN PO Take by mouth daily.   CALCIUM PO Take by mouth.   FLUoxetine 20 MG capsule Commonly known as: PROZAC TAKE 1 CAPSULE BY MOUTH EVERY DAY   multivitamin tablet Take 1 tablet by mouth daily.   nicotine polacrilex 4 MG gum Commonly known as: NICORETTE Take 4 mg by mouth as needed for smoking cessation.   PROBIOTIC-10 PO Take by mouth.   RESVERATIN PLUS PO Take by mouth.   VITAMIN B12 PO Take by mouth.   VITAMIN D3 PO Take by mouth.        All past medical history, surgical history, allergies, family history, immunizations andmedications were updated in the EMR today and reviewed under the history and medication portions of their EMR.     No results found for this or any previous visit (from the past 2160 hour(s)).  No results found.   ROS 14 pt review of systems performed and negative (unless mentioned in an HPI)  Objective: BP (!) 94/58    Pulse 76    Temp (!) 97.4 F (36.3 C) (Oral)    Ht 5\' 4"  (1.626 m)    Wt 131 lb (59.4 kg)    SpO2 100%    BMI 22.49 kg/m  Physical Exam Vitals and nursing note reviewed.  Constitutional:      General: She is not in acute distress.    Appearance: Normal appearance. She is not ill-appearing or toxic-appearing.  HENT:     Head: Normocephalic and atraumatic.     Right Ear: Tympanic membrane, ear canal and external ear normal. There is no  impacted cerumen.     Left Ear: Tympanic membrane, ear canal and external ear normal. There is no impacted cerumen.     Nose: No congestion or rhinorrhea.     Mouth/Throat:     Mouth: Mucous membranes are moist.     Pharynx: Oropharynx is clear. No oropharyngeal exudate or posterior oropharyngeal erythema.  Eyes:     General: No scleral icterus.       Right eye: No  discharge.        Left eye: No discharge.     Extraocular Movements: Extraocular movements intact.     Conjunctiva/sclera: Conjunctivae normal.     Pupils: Pupils are equal, round, and reactive to light.  Cardiovascular:     Rate and Rhythm: Normal rate and regular rhythm.     Pulses: Normal pulses.     Heart sounds: Normal heart sounds. No murmur heard.   No friction rub. No gallop.  Pulmonary:     Effort: Pulmonary effort is normal. No respiratory distress.     Breath sounds: Normal breath sounds. No stridor. No wheezing, rhonchi or rales.  Chest:     Chest wall: No tenderness.  Abdominal:     General: Abdomen is flat. Bowel sounds are normal. There is no distension.     Palpations: Abdomen is soft. There is no mass.     Tenderness: There is no abdominal tenderness. There is no right CVA tenderness, left CVA tenderness, guarding or rebound.     Hernia: No hernia is present.  Musculoskeletal:        General: No swelling, tenderness or deformity. Normal range of motion.     Cervical back: Normal range of motion and neck supple. No rigidity or tenderness.     Right lower leg: No edema.     Left lower leg: No edema.  Lymphadenopathy:     Cervical: No cervical adenopathy.  Skin:    General: Skin is warm and dry.     Coloration: Skin is not jaundiced or pale.     Findings: No bruising, erythema, lesion or rash.  Neurological:     General: No focal deficit present.     Mental Status: She is alert and oriented to person, place, and time. Mental status is at baseline.     Cranial Nerves: No cranial nerve deficit.      Sensory: No sensory deficit.     Motor: No weakness.     Coordination: Coordination normal.     Gait: Gait normal.     Deep Tendon Reflexes: Reflexes normal.  Psychiatric:        Mood and Affect: Mood normal.        Behavior: Behavior normal.        Thought Content: Thought content normal.        Judgment: Judgment normal.     No results found.  Assessment/plan: Tajae Maiolo is a 51 y.o. female present for CPE/CMC Depression, major, single episode, mild (HCC) Stable Continue prozac 20 mg - TSH - f/u 5.5 mos, sooner if needed Diabetes mellitus screening - Hemoglobin A1c Screening for deficiency anemia - CBC Encounter for long-term current use of medication - Comprehensive metabolic panel Lipid screening - Lipid panel Breast cancer screening by mammogram - MM DIGITAL SCREENING W/ IMPLANTS BILATERAL; Future  Encounter for preventive health examination Colonoscopy: MGF had colon cancer in his 45. Colonoscopy 12/2019- 3 yr follow up recommended. Due 2024. Dr. Tarri Glenn Mammogram: completed: 02/2021 [facility], . Pt has silicone implants.  Cervical cancer screening: last pap: she is uncertain her last.  Immunizations: tdap updated 2020, Influenza declined (encouraged yearly), shingrix  completed. Covid completed Infectious disease screening: HIV declined. Hep c completed DEXA: routine screen Patient was encouraged to exercise greater than 150 minutes a week. Patient was encouraged to choose a diet filled with fresh fruits and vegetables, and lean meats. AVS provided to patient today for education/recommendation on gender specific health and safety maintenance.  Return in about 24 weeks (around 07/17/2022) for CMC (30 min).  Orders Placed This Encounter  Procedures   MM DIGITAL SCREENING W/ IMPLANTS BILATERAL   CBC   Comprehensive metabolic panel   Hemoglobin A1c   Lipid panel   TSH   No orders of the defined types were placed in this encounter.  Referral Orders   No referral(s) requested today   Electronically signed by: Howard Pouch, [place]

## 2022-02-16 ENCOUNTER — Telehealth: Payer: Self-pay | Admitting: Family Medicine

## 2022-02-16 NOTE — Telephone Encounter (Signed)
FYI: ? ?Pt called said she is receiving a high bill due to OV. ? ?Pt said she was informed that "[place]" is out of network with our office. ? ?Told pt that [employer] elapsed, pt didn't understand. It look like its [place] out of state insurance.  ? ?Pt said her husband has insurance with them too, & he's not getting billed like she is. ? ?Gave patient Billing Number number. ? ?

## 2022-07-15 ENCOUNTER — Ambulatory Visit: Payer: PRIVATE HEALTH INSURANCE | Admitting: Family Medicine

## 2022-07-15 NOTE — Progress Notes (Deleted)
Patient ID: Allison Collins, female  DOB: 1971-03-06, 51 y.o.   MRN: [id number] Patient Care Team    Relationship Specialty Notifications Start End  Ma Hillock, DO PCP - General Family Medicine  09/12/19   Thornton Park, MD Consulting Physician Gastroenterology  01/29/21   Joseph Pierini, MD (Inactive) Consulting Physician Obstetrics and Gynecology  01/29/21   Eunice Blase, MD  Family Medicine  01/29/21   Dohmeier, Asencion Partridge, MD Consulting Physician Neurology  01/29/21     No chief complaint on file.   Subjective: Allison Collins is a 51 y.o.  Female  present for [facility]. All past medical history, surgical history, allergies, family history, immunizations, medications and social history were updated in the electronic medical record today. All recent labs, ED visits and hospitalizations within the last year were reviewed.   depression: *** Pt reports she has been prescribed prozac 20 mg qd since 2009 for depression. She reports she is compliant with medication and it is working well for her.     01/30/2022   10:21 AM 03/19/2021    3:09 PM 01/29/2021    1:05 PM 10/18/2020    8:14 AM 11/08/2019    1:25 PM  Depression screen PHQ 2/9  Decreased Interest 0 0 0 0 0  Down, Depressed, Hopeless 0 0 0 0 0  PHQ - 2 Score 0 0 0 0 0  Altered sleeping 0  0 0 0  Tired, decreased energy 0  0 0 0  Change in appetite 0  0 0 0  Feeling bad or failure about yourself  0  0 0 0  Trouble concentrating 0  0 0 0  Moving slowly or fidgety/restless 0  0 0 0  Suicidal thoughts 0  0 0 0  PHQ-9 Score 0  0 0 0  Difficult doing work/chores     Not difficult at all      01/30/2022   10:22 AM 10/18/2020    8:14 AM 09/12/2019    1:24 PM  GAD 7 : Generalized Anxiety Score  Nervous, Anxious, on Edge 0 0 0  Control/stop worrying 0 0 0  Worry too much - different things 0 0 0  Trouble relaxing 0 0 3  Restless 0 0   Easily annoyed or irritable 0 0 3  Afraid - awful might  happen 0 0 0  Total GAD 7 Score 0 0   Anxiety Difficulty   Extremely difficult    Immunization History  Administered Date(s) Administered   Influenza,inj,Quad PF,6+ Mos 09/12/2019, 10/18/2020   Janssen (J&J) SARS-COV-2 Vaccination 08/21/2020   Tdap 11/08/2019   Zoster Recombinat (Shingrix) 11/08/2019, 01/29/2021    Past Medical History:  Diagnosis Date   Allergy    Chicken pox    Depression    No Known Allergies Past Surgical History:  Procedure Laterality Date   ABDOMINOPLASTY     AUGMENTATION MAMMAPLASTY Bilateral    BREAST ENHANCEMENT SURGERY  2016   saline > 10 yrs with revision 1275 to silicone.    CESAREAN SECTION  2014   Family History  Problem Relation Age of Onset   Drug abuse Mother    Early death Mother 34   Stroke Mother    Pulmonary fibrosis Mother        passed away from condition   Hearing loss Father    Alzheimer's disease Maternal Grandmother    Breast cancer Maternal Grandmother    Colon cancer Maternal Grandfather  Colon polyps Neg Hx    Esophageal cancer Neg Hx    Rectal cancer Neg Hx    Stomach cancer Neg Hx    Social History   Social History Narrative   Marital status/children/pets: Married, 1 child   Education/employment: college, homemaker   Safety:      -smoke alarm in the home:Yes     - wears seatbelt: Yes     - Feels safe in their relationships: Yes    Allergies as of 07/15/2022   No Known Allergies      Medication List        Accurate as of July 15, 2022  8:08 AM. If you have any questions, ask your nurse or doctor.          BIOTIN PO Take by mouth daily.   CALCIUM PO Take by mouth.   FLUoxetine 20 MG capsule Commonly known as: PROZAC TAKE 1 CAPSULE BY MOUTH EVERY DAY   multivitamin tablet Take 1 tablet by mouth daily.   nicotine polacrilex 4 MG gum Commonly known as: NICORETTE Take 4 mg by mouth as needed for smoking cessation.   PROBIOTIC-10 PO Take by mouth.   RESVERATIN PLUS PO Take by mouth.    VITAMIN B12 PO Take by mouth.   VITAMIN D3 PO Take by mouth.        All past medical history, surgical history, allergies, family history, immunizations andmedications were updated in the EMR today and reviewed under the history and medication portions of their EMR.     No results found for this or any previous visit (from the past 2160 hour(s)).  No results found.   ROS 14 pt review of systems performed and negative (unless mentioned in an HPI)  Objective: There were no vitals taken for this visit. Physical Exam Vitals and nursing note reviewed.  Constitutional:      General: She is not in acute distress.    Appearance: Normal appearance. She is normal weight. She is not ill-appearing or toxic-appearing.  Eyes:     Extraocular Movements: Extraocular movements intact.     Conjunctiva/sclera: Conjunctivae normal.     Pupils: Pupils are equal, round, and reactive to light.  Neurological:     Mental Status: She is alert and oriented to person, place, and time. Mental status is at baseline.  Psychiatric:        Mood and Affect: Mood normal.        Behavior: Behavior normal.        Thought Content: Thought content normal.        Judgment: Judgment normal.     No results found.  Assessment/plan: Allison Collins is a 51 y.o. female present for [facility] Depression, major, single episode, mild (HCC) ***prozac 20 mg - f/u 5.5 mos, sooner if needed   No follow-ups on file.  No orders of the defined types were placed in this encounter.  No orders of the defined types were placed in this encounter.  Referral Orders  No referral(s) requested today   Electronically signed by: Howard Pouch, [place]

## 2022-07-17 IMAGING — CT CT CARDIAC CORONARY ARTERY CALCIUM SCORE
2 series · 15 of 20 positions shown, 17 images · non-contrast
Comparison: None.
COMPARISON: None.

Addendum:
CLINICAL DATA: 49-year-old Caucasian female with family history
heart disease.

EXAM:
CT CARDIAC CORONARY ARTERY CALCIUM SCORE
TECHNIQUE: Non-contrast imaging through the heart was performed using
prospective ECG gating. Image post processing was performed on an
independent workstation, allowing for quantitative analysis of the
heart and coronary arteries. Note that this exam targets the heart
and the chest was not imaged in its entirety.
CLINICAL DATA: Cardiovascular Disease Risk stratification 49 year
old female
Coronary Calcium Score
TECHNIQUE: A gated, non-contrast computed tomography scan of the heart was
performed using 3mm slice thickness. Axial images were analyzed on a
dedicated workstation. Calcium scoring of the coronary arteries was
performed using the Agatston method.

[Series 2: casc 3.0 i36f 2 bestdiast 70 % · axial · 0.39mm/px · z∈[-240,-135]mm · 8 of 47 slices shown, 10 images]
[im 6/47  vessel]
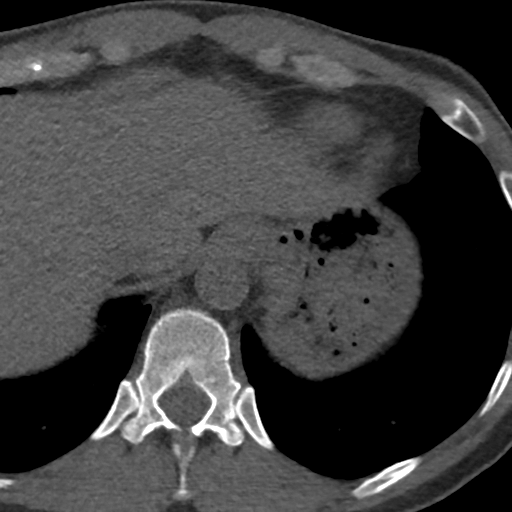
[im 6/47  lung]
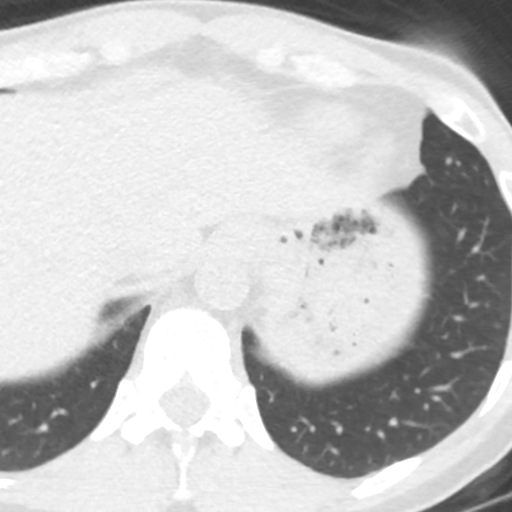
[im 11/47  vessel]
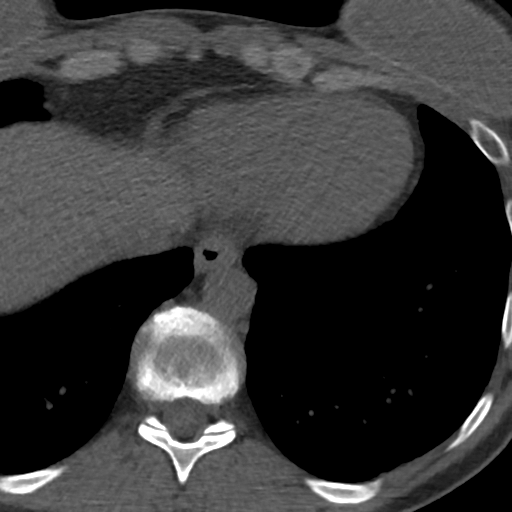
[im 16/47  vessel]
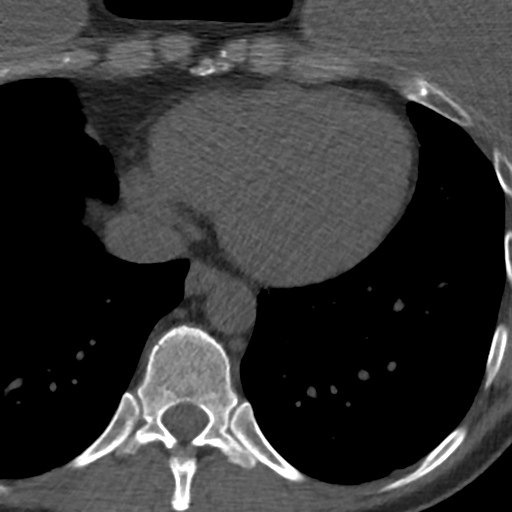
[im 21/47  vessel]
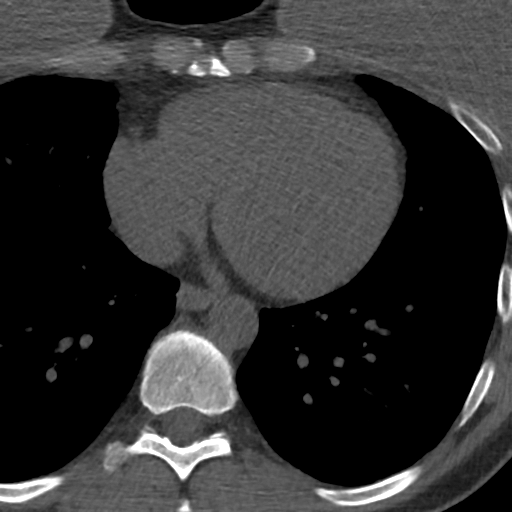
[im 26/47  vessel]
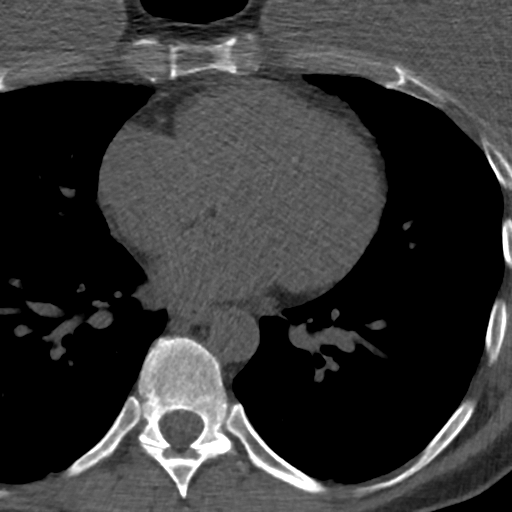
[im 26/47  lung]
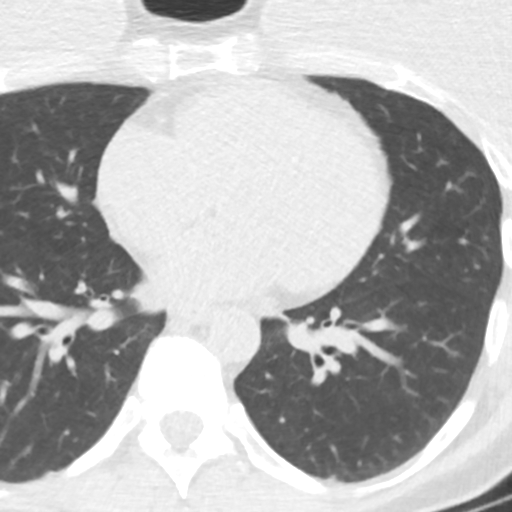
[im 31/47  vessel]
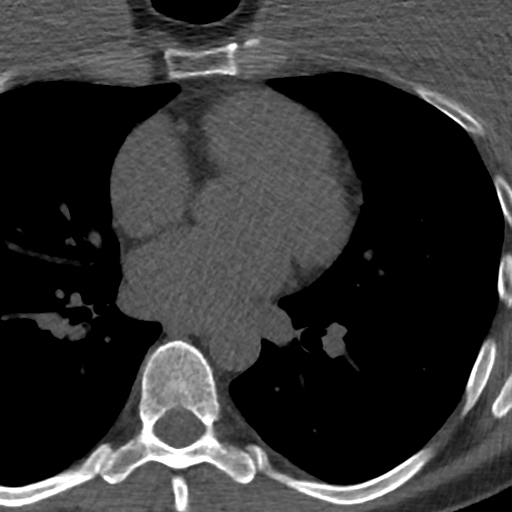
[im 36/47  vessel]
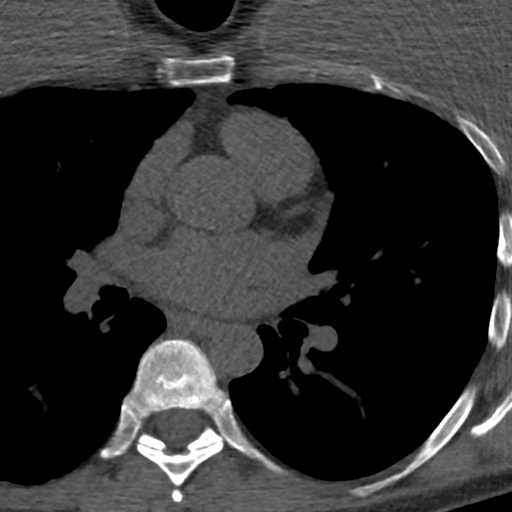
[im 41/47  vessel]
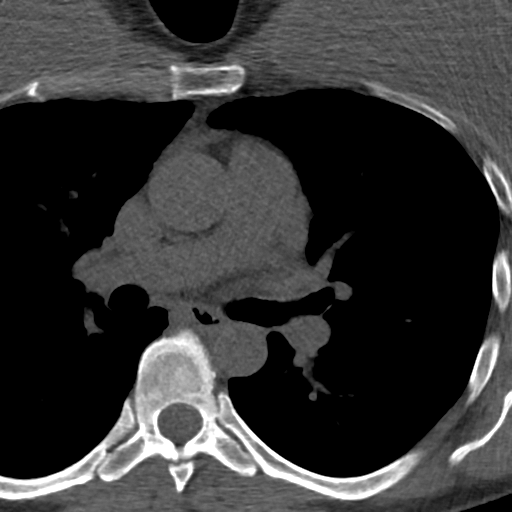

[Series 4: lung st 70 % · axial · 0.68mm/px · z∈[-240,-150]mm · 7 of 47 slices shown]
[im 6/47  lung]
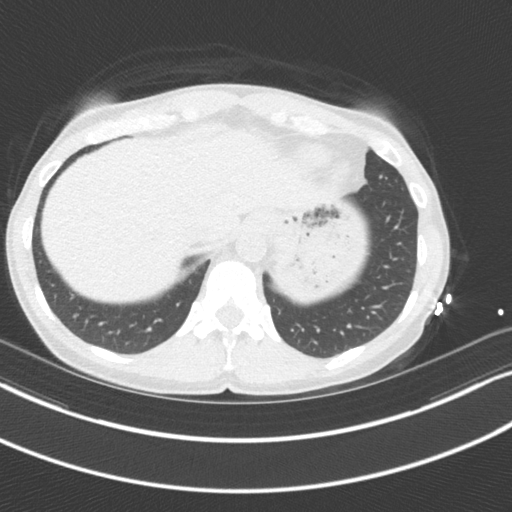
[im 11/47  lung]
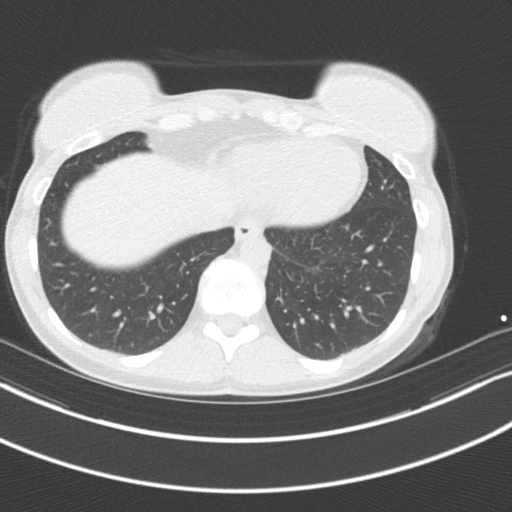
[im 16/47  lung]
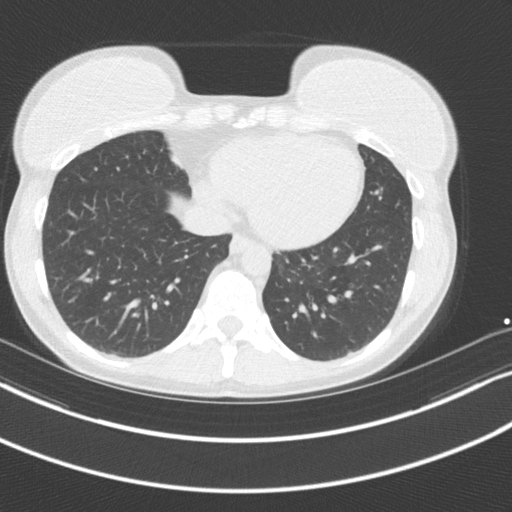
[im 21/47  lung]
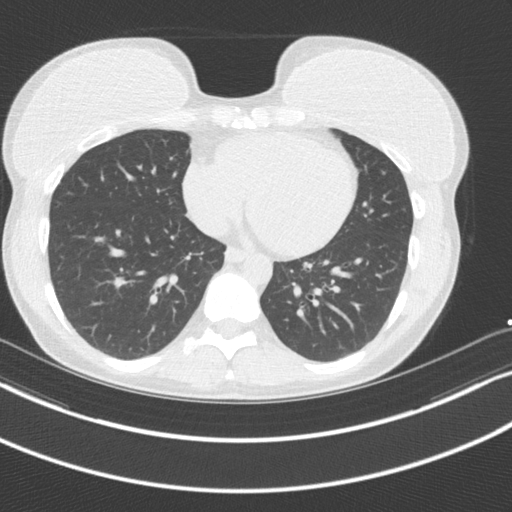
[im 26/47  lung]
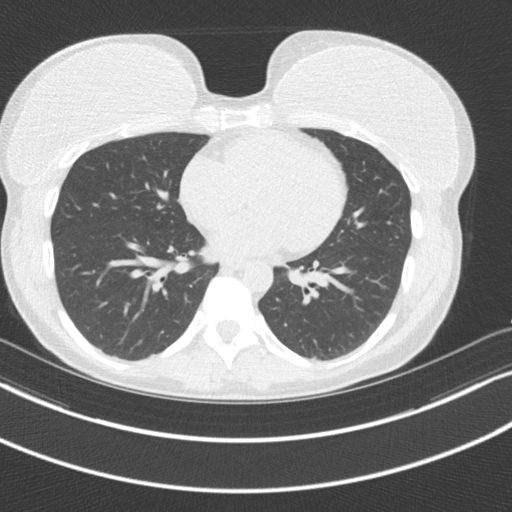
[im 31/47  lung]
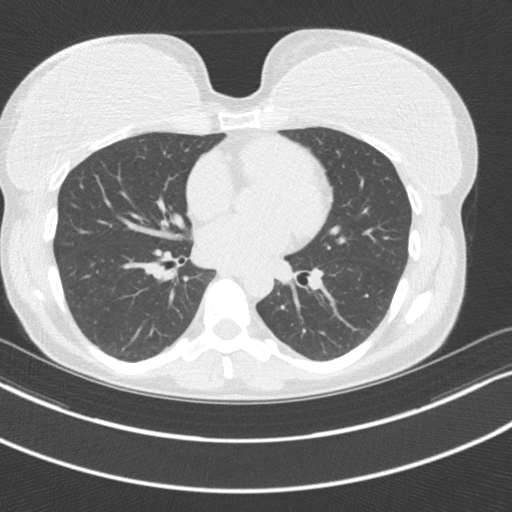
[im 36/47  lung]
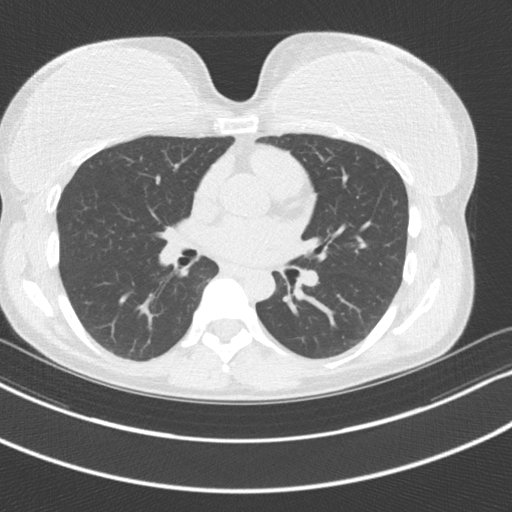

[15 of 20 positions shown; findings below may reference images not displayed]

FINDINGS: CORONARY CALCIUM SCORES:

Left Main: 0

LAD: 0

LCx: 0

RCA: 0

Total Agatston Score: 0

[HOSPITAL] percentile: 0

AORTA MEASUREMENTS:

Ascending Aorta: 30 mm

Descending Aorta: 20 mm

OTHER FINDINGS:

The heart size is within normal limits. No pericardial fluid is
identified. Visualized segments of the thoracic aorta and central
pulmonary arteries are of normal caliber. Visualized mediastinum and
hilar regions demonstrate no lymphadenopathy or masses. Visualized
lungs show no evidence of pulmonary edema, consolidation,
pneumothorax, nodule or pleural fluid. Visualized upper abdomen and
bony structures are unremarkable.
IMPRESSION: Coronary calcium score of 0.
FINDINGS: Coronary arteries: Normal origins.

Coronary Calcium Score: 0

Left main: 0

Left anterior descending artery: 0

Left circumflex artery: 0

Right coronary artery: 0

Total: 0

Percentile: 0

Pericardium: Normal.

Ascending Aorta: Normal caliber.  33 mm

Non-cardiac: See separate report from [REDACTED].
IMPRESSION: Coronary calcium score of 0. This was 0 percentile for age-, race-,
and sex-matched controls.



If CAC=0, it is reasonable to withhold statin therapy and reassess
in 5 to 10 years, as long as higher risk conditions are absent
(diabetes mellitus, family history of premature CHD in first degree
relatives (males <55 years; females <65 years), cigarette smoking,
or LDL >=190 mg/dL).

If CAC is 1 to 99, it is reasonable to initiate statin therapy for
patients >=55 years of age.

If CAC is >=100 or >=75th percentile, it is reasonable to initiate
statin therapy at any age.

Cardiology referral should be considered for patients with CAC
scores >=400 or >=75th percentile.

*8930 AHA/ACC/AACVPR/AAPA/ABC/OUSAMA FARISE/KABIR SANDER/OXENDINE/[place]/YAA/ANH PHUONG/BRYAM
Guideline on the Management of Blood Cholesterol: A Report of the
American College of Cardiology/American Heart Association Task Force
on Clinical Practice Guidelines. J Am Coll Cardiol.
9463;73(24):4822-4115.

*** End of Addendum ***
FINDINGS: CORONARY CALCIUM SCORES:

Left Main: 0

LAD: 0

LCx: 0

RCA: 0

Total Agatston Score: 0

[HOSPITAL] percentile: 0

AORTA MEASUREMENTS:

Ascending Aorta: 30 mm

Descending Aorta: 20 mm

OTHER FINDINGS:

The heart size is within normal limits. No pericardial fluid is
identified. Visualized segments of the thoracic aorta and central
pulmonary arteries are of normal caliber. Visualized mediastinum and
hilar regions demonstrate no lymphadenopathy or masses. Visualized
lungs show no evidence of pulmonary edema, consolidation,
pneumothorax, nodule or pleural fluid. Visualized upper abdomen and
bony structures are unremarkable.
IMPRESSION: Coronary calcium score of 0.

## 2022-07-17 IMAGING — MG DIGITAL SCREENING BREAST BILAT IMPLANT W/ TOMO W/ CAD
8 of 12 series · 8 of 28 positions shown · non-contrast
Comparison: Previous exam(s).

CLINICAL DATA: Screening.

EXAM:
DIGITAL SCREENING BILATERAL MAMMOGRAM WITH IMPLANTS, CAD AND
TOMOSYNTHESIS
TECHNIQUE: Bilateral screening digital craniocaudal and mediolateral oblique
mammograms were obtained. Bilateral screening digital breast
tomosynthesis was performed. The images were evaluated with
computer-aided detection. Standard and/or implant displaced views
were performed.

[L CC]
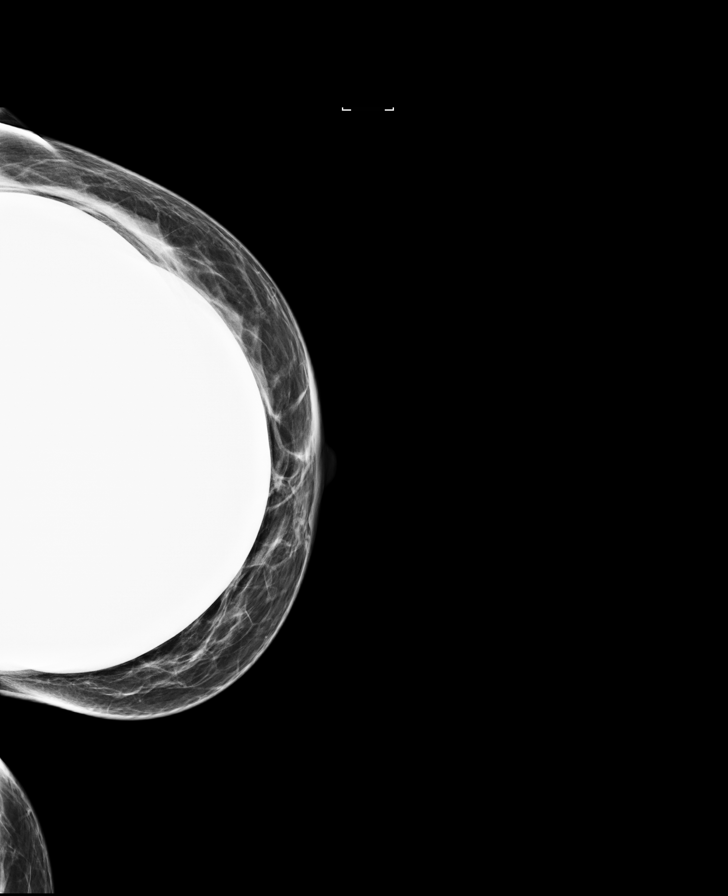

[R CC]
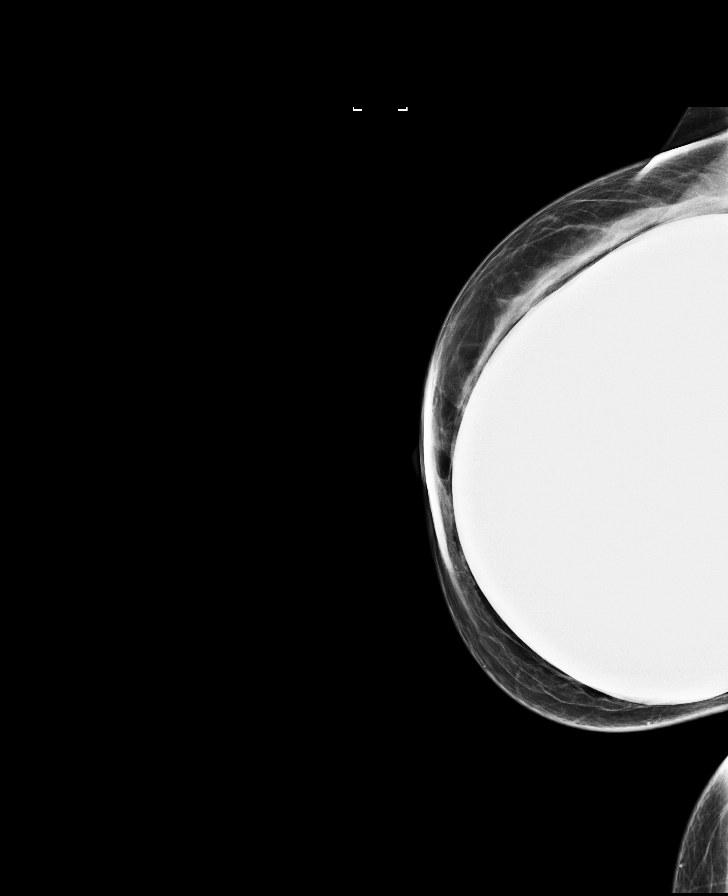

[L MLO]
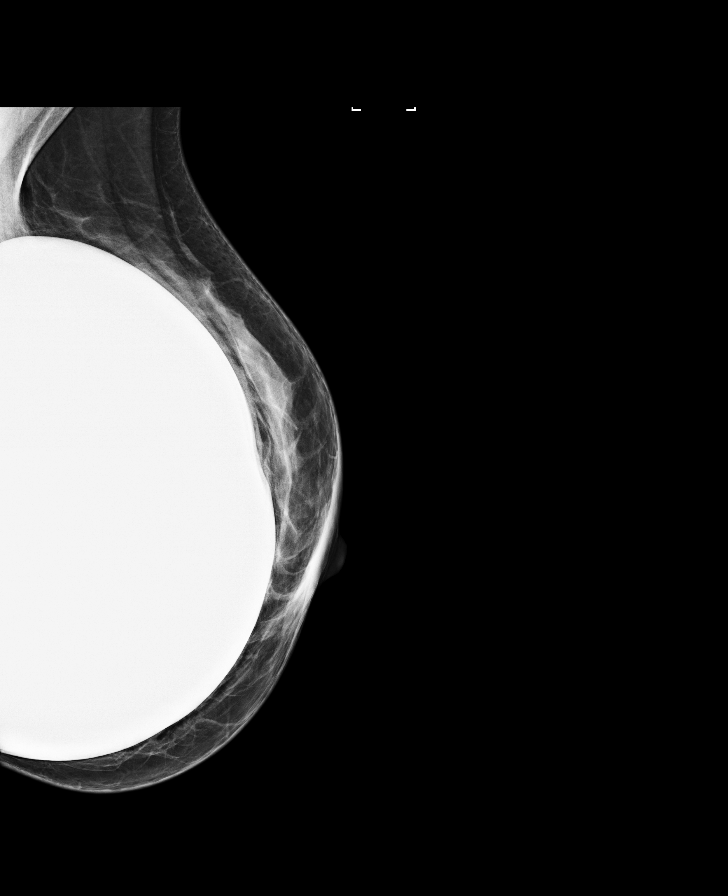

[R MLO]
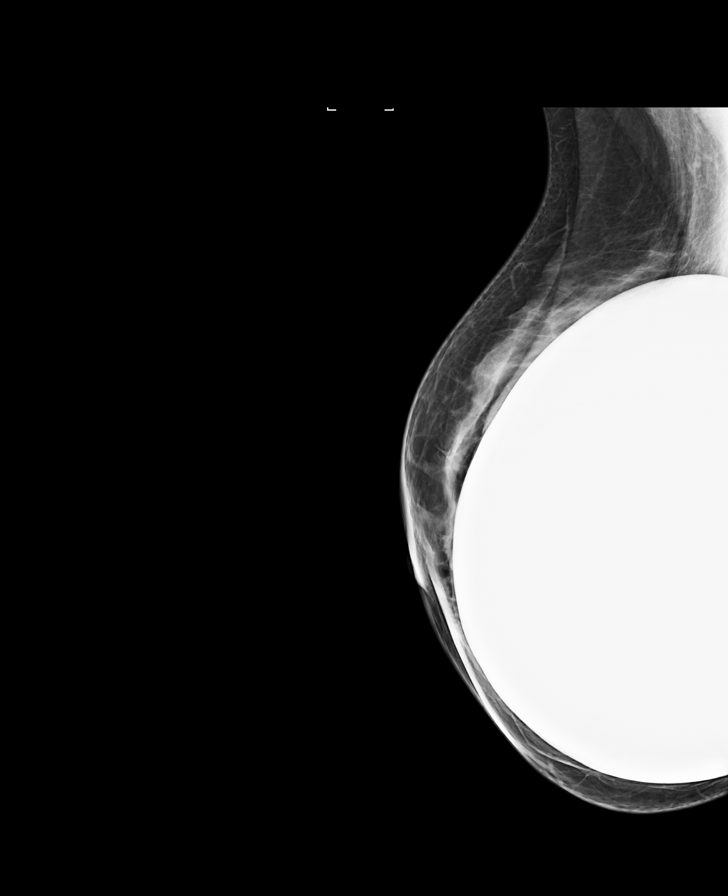

[R CC synth-2D]
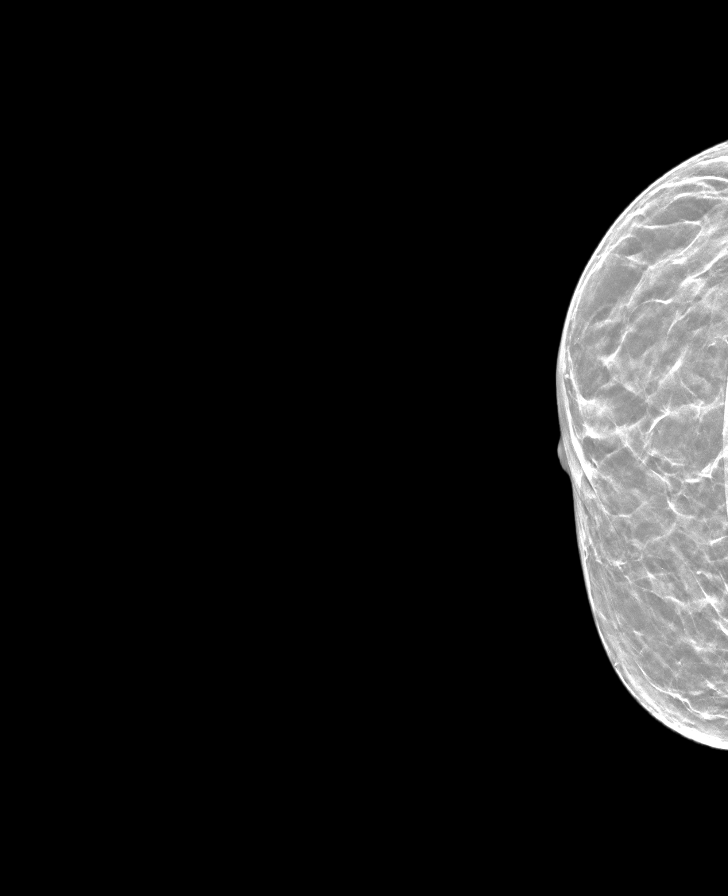

[L MLO synth-2D]
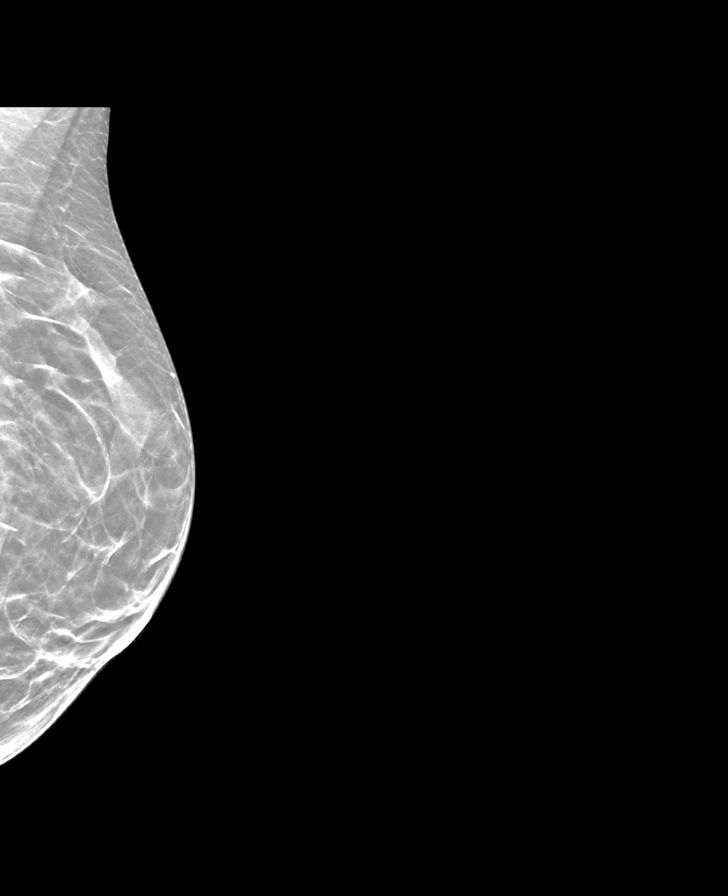

[L CC synth-2D]
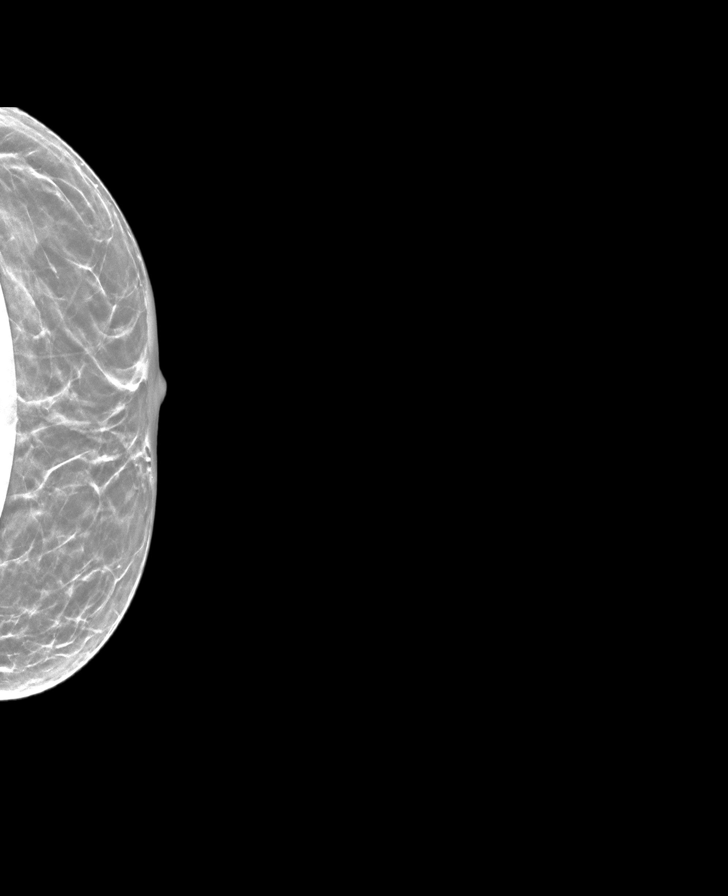

[R MLO synth-2D]
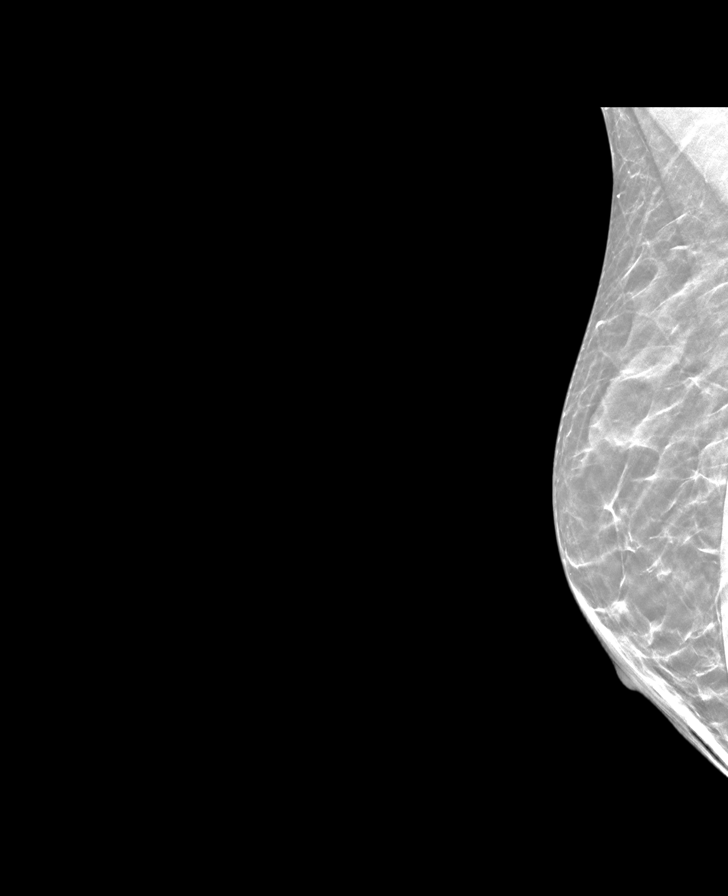

[8 of 28 positions shown; findings below may reference images not displayed]

ACR Breast Density Category c: The breast tissue is heterogeneously
dense, which may obscure small masses.
FINDINGS: The patient has retropectoral implants. There are no findings
suspicious for malignancy.
IMPRESSION: No mammographic evidence of malignancy. A result letter of this
screening mammogram will be mailed directly to the patient.

RECOMMENDATION:
Screening mammogram in one year. (Code:LT-E-7TH)

BI-RADS CATEGORY  1:  Negative.

## 2022-07-27 ENCOUNTER — Encounter: Payer: Self-pay | Admitting: Family Medicine

## 2022-11-16 ENCOUNTER — Other Ambulatory Visit: Payer: Self-pay | Admitting: Family Medicine

## 2024-03-30 ENCOUNTER — Emergency Department (HOSPITAL_COMMUNITY): Payer: Self-pay

## 2024-03-30 ENCOUNTER — Other Ambulatory Visit: Payer: Self-pay

## 2024-03-30 ENCOUNTER — Emergency Department (HOSPITAL_COMMUNITY)
Admission: EM | Admit: 2024-03-30 | Discharge: 2024-03-30 | Discharge status: Against Medical Advice | Payer: Self-pay | Attending: Emergency Medicine | Admitting: Emergency Medicine

## 2024-03-30 ENCOUNTER — Encounter (HOSPITAL_COMMUNITY): Payer: Self-pay

## 2024-03-30 DIAGNOSIS — R1031 Right lower quadrant pain: Secondary | ICD-10-CM | POA: Diagnosis present

## 2024-03-30 DIAGNOSIS — Z5321 Procedure and treatment not carried out due to patient leaving prior to being seen by health care provider: Secondary | ICD-10-CM | POA: Diagnosis not present

## 2024-03-30 LAB — URINALYSIS, ROUTINE W REFLEX MICROSCOPIC
Bilirubin Urine: NEGATIVE
Glucose, UA: NEGATIVE mg/dL
Hgb urine dipstick: NEGATIVE
Ketones, ur: 5 mg/dL — AB
Leukocytes,Ua: NEGATIVE
Nitrite: NEGATIVE
Protein, ur: NEGATIVE mg/dL
Specific Gravity, Urine: 1.016 (ref 1.005–1.030)
pH: 7 (ref 5.0–8.0)

## 2024-03-30 LAB — CBC WITH DIFFERENTIAL/PLATELET
Abs Immature Granulocytes: 0.02 10*3/uL (ref 0.00–0.07)
Basophils Absolute: 0 10*3/uL (ref 0.0–0.1)
Basophils Relative: 1 %
Eosinophils Absolute: 0.1 10*3/uL (ref 0.0–0.5)
Eosinophils Relative: 1 %
HCT: 37.5 % (ref 36.0–46.0)
Hemoglobin: 12.3 g/dL (ref 12.0–15.0)
Immature Granulocytes: 0 %
Lymphocytes Relative: 25 %
Lymphs Abs: 1.3 10*3/uL (ref 0.7–4.0)
MCH: 29.4 pg (ref 26.0–34.0)
MCHC: 32.8 g/dL (ref 30.0–36.0)
MCV: 89.7 fL (ref 80.0–100.0)
Monocytes Absolute: 0.4 10*3/uL (ref 0.1–1.0)
Monocytes Relative: 7 %
Neutro Abs: 3.6 10*3/uL (ref 1.7–7.7)
Neutrophils Relative %: 66 %
Platelets: 292 10*3/uL (ref 150–400)
RBC: 4.18 MIL/uL (ref 3.87–5.11)
RDW: 12.6 % (ref 11.5–15.5)
WBC: 5.4 10*3/uL (ref 4.0–10.5)
nRBC: 0 % (ref 0.0–0.2)

## 2024-03-30 LAB — COMPREHENSIVE METABOLIC PANEL WITH GFR
ALT: 21 U/L (ref 0–44)
AST: 27 U/L (ref 15–41)
Albumin: 4 g/dL (ref 3.5–5.0)
Alkaline Phosphatase: 62 U/L (ref 38–126)
Anion gap: 9 (ref 5–15)
BUN: 12 mg/dL (ref 6–20)
CO2: 24 mmol/L (ref 22–32)
Calcium: 9.6 mg/dL (ref 8.9–10.3)
Chloride: 105 mmol/L (ref 98–111)
Creatinine, Ser: 0.92 mg/dL (ref 0.44–1.00)
GFR, Estimated: >60 mL/min (ref >60)
Glucose, Bld: 98 mg/dL (ref 70–99)
Potassium: 5.4 mmol/L — ABNORMAL HIGH (ref 3.5–5.1)
Sodium: 138 mmol/L (ref 135–145)
Total Bilirubin: 0.9 mg/dL (ref 0.0–1.2)
Total Protein: 7.4 g/dL (ref 6.5–8.1)

## 2024-03-30 LAB — HCG, SERUM, QUALITATIVE: Preg, Serum: NEGATIVE

## 2024-03-30 LAB — LIPASE, BLOOD: Lipase: 35 U/L (ref 11–51)

## 2024-03-30 NOTE — ED Triage Notes (Signed)
 Reports pain started in right flank and has now come to from right lower quad.  Reports pain is intermittent.  Denies urinary symptomsl.

## 2024-03-30 NOTE — ED Provider Triage Note (Signed)
 Emergency Medicine Provider Triage Evaluation Note  Laurian Edrington , a 53 y.o. female  was evaluated in triage.  Pt complains of flank pain. Acute onset of R flank pain that started yesterday and now radiates to RLQ.  No fever, chills, cp, sob, dysuria, hematuria, vaginal bleeding or vaginal discharge.  No injury.  No hx of kidney stone  Review of Systems  Positive: As above Negative: As above  Physical Exam  BP 125/83 (BP Location: Left Arm)   Pulse 73   Temp 98.2 F (36.8 C)   Resp 20   Ht 5\' 4"  (1.626 m)   Wt 59.4 kg   SpO2 100%   BMI 22.49 kg/m  Gen:   Awake, no distress   Resp:  Normal effort  MSK:   Moves extremities without difficulty  Other:    Medical Decision Making  Medically screening exam initiated at 12:21 PM.  Appropriate orders placed.  Carleah Bari [place] was informed that the remainder of the evaluation will be completed by another provider, this initial triage assessment does not replace that evaluation, and the importance of remaining in the ED until their evaluation is complete.     Debbra Fairy, PA-C 03/30/24 1222

## 2024-03-30 NOTE — ED Notes (Signed)
Pt called multiple times for vitals with no answer

## 2024-04-05 ENCOUNTER — Ambulatory Visit

## 2024-04-05 ENCOUNTER — Other Ambulatory Visit: Payer: Self-pay | Admitting: Family Medicine

## 2024-04-05 DIAGNOSIS — Z1231 Encounter for screening mammogram for malignant neoplasm of breast: Secondary | ICD-10-CM

## 2024-04-07 ENCOUNTER — Ambulatory Visit
